# Patient Record
Sex: Male | Born: 1935 | Race: White | Hispanic: No | Smoking: Former smoker
Health system: Southern US, Community
[De-identification: ages and names within clinical notes are randomized; demographics above are authoritative.]

## PROBLEM LIST (undated history)

## (undated) DIAGNOSIS — J449 Chronic obstructive pulmonary disease, unspecified: Secondary | ICD-10-CM

## (undated) DIAGNOSIS — R739 Hyperglycemia, unspecified: Secondary | ICD-10-CM

## (undated) DIAGNOSIS — I10 Essential (primary) hypertension: Secondary | ICD-10-CM

## (undated) DIAGNOSIS — E162 Hypoglycemia, unspecified: Secondary | ICD-10-CM

## (undated) DIAGNOSIS — I442 Atrioventricular block, complete: Secondary | ICD-10-CM

## (undated) DIAGNOSIS — F0390 Unspecified dementia without behavioral disturbance: Secondary | ICD-10-CM

## (undated) HISTORY — DX: Hyperglycemia, unspecified: R73.9

## (undated) HISTORY — PX: VEIN LIGATION AND STRIPPING: SHX2653

## (undated) HISTORY — PX: COLONOSCOPY: SHX174

## (undated) HISTORY — DX: Essential (primary) hypertension: I10

## (undated) HISTORY — DX: Chronic obstructive pulmonary disease, unspecified: J44.9

---
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
## 1999-12-22 ENCOUNTER — Encounter: Payer: Self-pay | Admitting: Emergency Medicine

## 1999-12-22 ENCOUNTER — Inpatient Hospital Stay (HOSPITAL_COMMUNITY): Admission: EM | Admit: 1999-12-22 | Discharge: 1999-12-24 | Payer: Self-pay | Admitting: Emergency Medicine

## 1999-12-23 ENCOUNTER — Encounter: Payer: Self-pay | Admitting: Family Medicine

## 2003-10-17 ENCOUNTER — Encounter: Admission: RE | Admit: 2003-10-17 | Discharge: 2003-10-17 | Payer: Self-pay | Admitting: Gastroenterology

## 2005-10-25 ENCOUNTER — Ambulatory Visit (HOSPITAL_COMMUNITY): Admission: RE | Admit: 2005-10-25 | Discharge: 2005-10-25 | Payer: Self-pay | Admitting: Gastroenterology

## 2005-10-25 ENCOUNTER — Encounter (INDEPENDENT_AMBULATORY_CARE_PROVIDER_SITE_OTHER): Payer: Self-pay | Admitting: Specialist

## 2006-02-19 ENCOUNTER — Encounter: Payer: Self-pay | Admitting: Emergency Medicine

## 2006-02-20 ENCOUNTER — Inpatient Hospital Stay (HOSPITAL_COMMUNITY): Admission: EM | Admit: 2006-02-20 | Discharge: 2006-02-22 | Payer: Self-pay | Admitting: Emergency Medicine

## 2008-03-14 ENCOUNTER — Encounter: Admission: RE | Admit: 2008-03-14 | Discharge: 2008-03-14 | Payer: Self-pay | Admitting: Gastroenterology

## 2009-04-23 ENCOUNTER — Emergency Department (HOSPITAL_COMMUNITY): Admission: EM | Admit: 2009-04-23 | Discharge: 2009-04-23 | Payer: Self-pay | Admitting: Emergency Medicine

## 2010-09-20 ENCOUNTER — Inpatient Hospital Stay (HOSPITAL_COMMUNITY)
Admission: EM | Admit: 2010-09-20 | Discharge: 2010-09-22 | Payer: Self-pay | Source: Home / Self Care | Attending: Internal Medicine | Admitting: Internal Medicine

## 2010-09-21 ENCOUNTER — Encounter (INDEPENDENT_AMBULATORY_CARE_PROVIDER_SITE_OTHER): Payer: Self-pay | Admitting: Internal Medicine

## 2010-12-06 LAB — DIFFERENTIAL
Basophils Relative: 0 % (ref 0–1)
Eosinophils Absolute: 0 10*3/uL (ref 0.0–0.7)
Lymphocytes Relative: 4 % — ABNORMAL LOW (ref 12–46)
Monocytes Absolute: 0.9 10*3/uL (ref 0.1–1.0)
Monocytes Relative: 7 % (ref 3–12)
Neutro Abs: 10.1 10*3/uL — ABNORMAL HIGH (ref 1.7–7.7)

## 2010-12-06 LAB — CBC
HCT: 35.9 % — ABNORMAL LOW (ref 39.0–52.0)
Hemoglobin: 12.6 g/dL — ABNORMAL LOW (ref 13.0–17.0)
MCH: 29.9 pg (ref 26.0–34.0)
MCHC: 33.7 g/dL (ref 30.0–36.0)
MCHC: 34.8 g/dL (ref 30.0–36.0)
MCV: 86.7 fL (ref 78.0–100.0)
Platelets: 226 10*3/uL (ref 150–400)
RBC: 4.21 MIL/uL — ABNORMAL LOW (ref 4.22–5.81)
RDW: 14.4 % (ref 11.5–15.5)
WBC: 10.8 10*3/uL — ABNORMAL HIGH (ref 4.0–10.5)

## 2010-12-06 LAB — BASIC METABOLIC PANEL
BUN: 26 mg/dL — ABNORMAL HIGH (ref 6–23)
Calcium: 8.6 mg/dL (ref 8.4–10.5)
GFR calc non Af Amer: >60 mL/min (ref >60)
Glucose, Bld: 85 mg/dL (ref 70–99)
Potassium: 4.8 mEq/L (ref 3.5–5.1)

## 2010-12-06 LAB — GLUCOSE, CAPILLARY: Glucose-Capillary: 117 mg/dL — ABNORMAL HIGH (ref 70–99)

## 2010-12-06 LAB — URINALYSIS, ROUTINE W REFLEX MICROSCOPIC
Ketones, ur: NEGATIVE mg/dL
pH: 6 (ref 5.0–8.0)

## 2010-12-06 LAB — POCT CARDIAC MARKERS
CKMB, poc: 1.1 ng/mL (ref 1.0–8.0)
Myoglobin, poc: 111 ng/mL (ref 12–200)
Myoglobin, poc: 223 ng/mL (ref 12–200)
Troponin i, poc: 0.09 ng/mL (ref 0.00–0.09)
Troponin i, poc: 0.14 ng/mL — ABNORMAL HIGH (ref 0.00–0.09)

## 2010-12-06 LAB — COMPREHENSIVE METABOLIC PANEL
ALT: 21 U/L (ref 0–53)
AST: 22 U/L (ref 0–37)
AST: 24 U/L (ref 0–37)
BUN: 32 mg/dL — ABNORMAL HIGH (ref 6–23)
CO2: 26 mEq/L (ref 19–32)
Calcium: 9 mg/dL (ref 8.4–10.5)
Calcium: 9.4 mg/dL (ref 8.4–10.5)
Chloride: 103 mEq/L (ref 96–112)
GFR calc Af Amer: 58 mL/min — ABNORMAL LOW (ref >60)
GFR calc non Af Amer: 55 mL/min — ABNORMAL LOW (ref >60)
Glucose, Bld: 178 mg/dL — ABNORMAL HIGH (ref 70–99)
Potassium: 4 mEq/L (ref 3.5–5.1)
Potassium: 4.2 mEq/L (ref 3.5–5.1)
Sodium: 133 mEq/L — ABNORMAL LOW (ref 135–145)
Sodium: 134 mEq/L — ABNORMAL LOW (ref 135–145)
Total Bilirubin: 0.5 mg/dL (ref 0.3–1.2)
Total Protein: 7.1 g/dL (ref 6.0–8.3)

## 2010-12-06 LAB — BLOOD GAS, ARTERIAL
Bicarbonate: 23 mEq/L (ref 20.0–24.0)
O2 Saturation: 89.4 %
Patient temperature: 98.6

## 2010-12-06 LAB — HEMOGLOBIN A1C: Mean Plasma Glucose: 126 mg/dL — ABNORMAL HIGH (ref <117)

## 2010-12-06 LAB — CARDIAC PANEL(CRET KIN+CKTOT+MB+TROPI)
CK, MB: 2.5 ng/mL (ref 0.3–4.0)
Total CK: 117 U/L (ref 7–232)
Troponin I: 0.02 ng/mL (ref 0.00–0.06)

## 2010-12-06 LAB — D-DIMER, QUANTITATIVE: D-Dimer, Quant: 0.42 ug/mL-FEU (ref 0.00–0.48)

## 2010-12-06 LAB — URINE CULTURE
Colony Count: NO GROWTH
Culture  Setup Time: 201112270409
Culture: NO GROWTH

## 2010-12-06 LAB — LIPID PANEL
HDL: 44 mg/dL (ref >39)
Triglycerides: 35 mg/dL (ref <150)

## 2010-12-06 LAB — PROTIME-INR: Prothrombin Time: 13.6 seconds (ref 11.6–15.2)

## 2010-12-06 LAB — TSH: TSH: 0.575 u[IU]/mL (ref 0.350–4.500)

## 2011-01-02 LAB — POCT CARDIAC MARKERS
CKMB, poc: 1.2 ng/mL (ref 1.0–8.0)
Myoglobin, poc: 62.2 ng/mL (ref 12–200)

## 2011-01-02 LAB — DIFFERENTIAL
Lymphocytes Relative: 6 % — ABNORMAL LOW (ref 12–46)
Lymphs Abs: 0.4 10*3/uL — ABNORMAL LOW (ref 0.7–4.0)
Neutro Abs: 7.2 10*3/uL (ref 1.7–7.7)
Neutrophils Relative %: 92 % — ABNORMAL HIGH (ref 43–77)

## 2011-01-02 LAB — URINALYSIS, ROUTINE W REFLEX MICROSCOPIC
Glucose, UA: NEGATIVE mg/dL
Nitrite: NEGATIVE
Protein, ur: NEGATIVE mg/dL
Urobilinogen, UA: 0.2 mg/dL (ref 0.0–1.0)

## 2011-01-02 LAB — CBC
Platelets: 234 10*3/uL (ref 150–400)
WBC: 7.8 10*3/uL (ref 4.0–10.5)

## 2011-01-02 LAB — POCT I-STAT, CHEM 8
BUN: 19 mg/dL (ref 6–23)
Creatinine, Ser: 1 mg/dL (ref 0.4–1.5)
Potassium: 3.9 mEq/L (ref 3.5–5.1)
Sodium: 139 mEq/L (ref 135–145)

## 2011-01-02 LAB — GLUCOSE, CAPILLARY

## 2011-02-11 NOTE — Discharge Summary (Signed)
[place]. [facility]  Patient:    William Cisneros, William Cisneros                      MRN: [id number] Adm. Date:  [id number] Disc. Date: 12/24/99 Attending:  Marily Memos                           Discharge Summary  ADMITTING DIAGNOSIS:  Syncopal episode.  DISCHARGE DIAGNOSES: 1. Syncopal episode secondary to orthostatic hypotension from flu-like illness.  2. Hypercholesterolemia. 3. Borderline hypertension. 4. Hyperglycemia. 5. Status post hemorrhoidectomy and varicose vein surgery. 5. Old right bundle branch block.  CONSULTS:  None.  PROCEDURES:  Head CT showing no acute changes.  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  EKG showing normal sinus rhythm with right bundle branch block.  HOSPITAL COURSE:  [name] was admitted after having a syncopal episode for several minutes after he had some flu-like symptoms the past few days before admission.  He had not been eating or drinking very much.  This was after exertion. His wife heard him fall and EMS was called and then he was transferred to the emergency room.  Head CT revealed no acute changes.  On exam, he did have intention tremor of the left arm and right tongue deviation.  He was also orthostatic. Head MRI revealed an old punctate lacunar infarct of the pons but no other findings other than some small vessel disease.  He was also hyperglycemic slightly on admission and hemoglobin A1C was noted to be slightly elevated at 7.6.  He was admitted for observation.  Telemetry revealed no abnormalities.  He felt better  within a day.  Orthostatic hypotension resolved.  All tests were reviewed with he patient and his wife.  LABORATORY DATA:  Hemoglobin A1C 7.6, WBC 4.0, hemoglobin 13.1, platelets 199, 8% neutrophils, ANC 3.2, sed rate 15, INR 1.0l, sodium 134, potassium 4.1, chloride 104, bicarb 24, glucose 123, BUN 17,  creatinine 1.0, calcium 7.9, total protein  5.8, albumin 2.7, AST 26, ALT 18, alkaline phosphatase 57, total bilirubin 0.5,  lipase 40, CK 57, MB less than 0.3, troponin I less than 0.03.  DISCHARGE MEDICATIONS:  Enteric-coated aspirin 325 mg one p.o. q.d.  FOLLOW-UP:  Patient is to make an appointment to follow up with Dr. Arlyce Dice in our office for hyperglycemia, MRI findings of old, very small infarct and also for is blood pressure that was borderline this admission, also instructed to follow a low-fat, low-sodium diet with no added sugar.  He will call if he has any problems.  CONDITION AT DISCHARGE:  Improved. DD:  12/24/99 TD:  12/24/99 Job: [id number]

## 2011-02-11 NOTE — Op Note (Signed)
[name], [name]             ACCOUNT NO.:  [id number]   MEDICAL RECORD NO.:  [id number]          PATIENT TYPE:  AMB   LOCATION:  ENDO                         FACILITY:  [facility]   PHYSICIAN:  Danise Edge, M.D.   DATE OF BIRTH:  Aug 20, 1924   DATE OF PROCEDURE:  10/25/2005  DATE OF DISCHARGE:                                 OPERATIVE REPORT   PROCEDURE:  Esophagogastroduodenoscopy.   REFERRING PHYSICIAN:  Dr. Dara Hoyer, [facility].   INDICATIONS:  Mr. William Cisneros is a 77 year old male born [birth year].  William Cisneros has intermittent solid food dysphagia. He underwent a barium  tablet with liquid barium swallow. There was transient hangup of the barium  tablet at the level of the aortic arch; cricopharyngeal spasm was also  noted. William Cisneros wife developed esophageal cancer.   ENDOSCOPIST:  Danise Edge, M.D.   PREMEDICATION:  Versed 5 mg, Demerol 50 mg.   DESCRIPTION OF PROCEDURE:  After obtaining informed consent, William Cisneros was  placed in the left lateral decubitus position. I administered intravenous  Demerol and intravenous Versed to achieve conscious sedation for the  procedure. The patient's blood pressure, oxygen saturation and cardiac  rhythm were monitored throughout the procedure and documented in the medical  record.   The Olympus gastroscope was passed through the posterior hypopharynx into  the proximal esophagus without difficulty. The hypopharynx, larynx and vocal  cords appeared normal.   ESOPHAGOSCOPY:  The proximal, mid and lower segments of the esophageal  mucosa appear completely normal. There is no endoscopic evidence for the  presence of esophageal obstruction, esophageal stricture formation,  Barrett's esophagus or erosive esophagitis. The squamocolumnar junction is  noted at approximately 40 cm from the incisor teeth.   GASTROSCOPY:  Retroflexed view of the gastric cardia and fundus was normal.  The diaphragmatic  hiatus is only slightly patulous. The gastric body appears  normal. There are three punctate erosions in the gastric antrum with exudate  basis. Biopsies were performed to rule out Helicobacter pylori antral  gastritis. The pylorus appears normal.   DUODENOSCOPY:  There is a small isolated erosion in the duodenal bulb with a  normal-appearing second portion of  duodenum and third portion of duodenum.   ASSESSMENT:  1.  Erosions in the gastric antrum biopsied.  2.  A small isolated erosion is noted in the duodenal bulb.  3.  The esophagus appears completely normal.           ______________________________  Danise Edge, M.D.     MJ/MEDQ  D:  10/25/2005  T:  10/26/2005  Job:  [id number]   cc:   Teena Irani. Arlyce Dice, M.D.  Fax: [phone]

## 2011-02-11 NOTE — Discharge Summary (Signed)
William Cisneros, William Cisneros             ACCOUNT NO.:  [id number]   MEDICAL RECORD NO.:  [id number]          PATIENT TYPE:  INP   LOCATION:  1610                         FACILITY:  [facility]   PHYSICIAN:  Isidor Holts, M.D.  DATE OF BIRTH:  17-Nov-1923   DATE OF ADMISSION:  02/20/2006  DATE OF DISCHARGE:  02/22/2006                                 DISCHARGE SUMMARY   DISCHARGE DIAGNOSES:  1.  Cervical radiculopathy.  2.  Acute gastroenteritis.  3.  Dehydration/Presyncopal episode.  4.  History of dyslipidemia.  5.  History of previous transient ischemic attack.  6.  Hypertension.   DISCHARGE MEDICATIONS:  1.  Aspirin 81 mg p.o. daily.  2.  Altace 10 mg p.o. daily.  3.  Protonix 40 mg p.o. daily.  4.  Flexeril 10 mg p.o. nightly.  5.  Methylprednisone Dosepak starting with 8 mg p.o. t.i.d., then taper      Methylprednisone Dosepak over the next 5 days.   PROCEDURES:  1.  Brain MRI dated Feb 17, 2006:  This showed atrophy with moderate small      vessel disease-type changes, no acute infarct or abnormal intracranial      enhancing lesion.  2.  Brain MRA dated Feb 17, 2006:  This showed no medium or large vessel      significant stenosis or occlusion.  3.  Two-view chest x-ray dated Feb 17, 2006:  This showed chronic      obstructive pulmonary disease, but no acute findings.   CONSULTATIONS:  Dr. Porfirio Mylar Dohmeier, [facility].   ADMISSION HISTORY:  As in H&P notes of Feb 20, 2006, dictated by Dr. Freeman Caldron. However, in brief, this is a 77 year old male, with known history of  dyslipidemia, previous TIA, hypertension, prior history of gastritis,  associated with H. pylori, status post eradicative treatment, who presents  initially on Feb 19, 2006 with left facial numbness, left facial  droop/slurring of speech.  CT scan done at that time, showed mild small  vessel ischemic changes in periventricular white matter.  The patient was  subsequently allowed home from the emergenc  department, following resolution  of symptoms.  On detailed questioning, however, it appears that the  patient's symptoms had started approximately 4 days prior, and the patient  had developed nausea and vomiting over 2-3 days prior to this presentation.  While the patient was in his PMD's office on Feb 20, 2006, he had a  presyncopal episode, associated with dizziness, nausea and vomiting and was  brought to the emergency department.  He was subsequently admitted for  further evaluation, investigation and management.   CLINICAL COURSE:  PROBLEM #1 - ACUTE GASTRITIS:  The patient presented with  a 2-3-day history of nausea, epigastric pain and vomiting.  He denies  diarrhea.  There was no clustering of cases, no ingestion of unusual foods,  no recent travel.  The patient responded to intravenous fluid hydration,  antiemetics, proton pump inhibitor treatment with good clinical effect.  By  Feb 21, 2006, the patient no longer had any further episodes of vomiting,  was able to maintain oral  intake without any problems whatsoever.  Abdominal  discomfort also resolved.   PROBLEM #2 - PRESYNCOPAL EPISODE:  Although there are varied accounts of  this episode, the patient states that as he got up from a chair in his PMD's  office, he experienced nausea, severe dizziness, sat back down and vomited.  Family states that the patient actually passed out over the table.  Be  that as it may, the patient was found to have orthostasis on physical  evaluation on Feb 21, 2006.  Also at that time, his BUN was 21 with a  creatinine of 1.2.  These features were suggestive of dehydration,  associated with acute gastritis and vomiting.  He responded to intravenous  fluid infusion with normal saline.  By Feb 22, 2006, he was completely  asymptomatic, was able to ambulate without any problems whatsoever.   PROBLEM #3 - CERVICAL RADICULOPATHY:  The patient presented with a 4-day  history of left-sided facial  numbness, also some scalp tenderness.  In  addition, he experienced escalation of aforementioned symptoms, on lateral  rotation of neck to the left.  There was no associated jaw or tongue  claudication, no visual symptoms, this was clearly not a TIA, although the  patient has had TIA in the past.  Brain imaging, i.e., MRI/MRA, showed no  evidence of acute infarct or other enhancing lesion.  The patient certainly,  had no focal neurology, even on objective testing, of either a sensory or  motor nature.  ESR done, to rule out possible temporal arteritis, was  37mm/hr, i.e., unremarkable.  Because of puzzling neurological  symptomatology, it was also felt that Neurology input would be useful.  Neurology consultation was therefore called, and was kindly provided by Dr.  Porfirio Mylar Dohmeier, who concluded that the patient probably had a cervical  radiculopathy, associated with tenderness on deep palpation of left  supraclavicular fossa.  She recommended the utilization of Flexeril and a  Medrol Dosepak. This was instituted, with rather dramatic amelioration of  the patient's symptoms.  Per Dr. Oliva Bustard recommendation, should the  patient still be symptomatic on this regimen after 14 days, it might be well  worthwhile to arrange an outpatient EMG and nerve conduction studies.  The  patient underwent a Speech Pathology evaluation during the course of his  hospital stay.  There was no evidence of dysphagia.  RPR was nonreactive.  TSH was 1.201. Vitamin B12 was 299.   PROBLEM #4 - HISTORY OF HYPERTENSION:  This was managed with pre-admission  dosage of Altace.  On Feb 22, 2006, there was a slight bump in blood  pressure to 161/86 mmHg. At the time, the patient was on intravenous  infusion of normal saline. This has been discontinued of course, and we  expect his blood pressure to normalize.  However, we shall expect the patient's primary care physician to continue to monitor the patient's blood   pressure, and adjust his medications as indicated.   DISPOSITION:  The patient was considered clinically stable for discharge on  Feb 22, 2006.   DIET:  No restrictions, other than healthy-heart diet.   ACTIVITY:  As tolerated.   WOUND CARE:  Not applicable.   PAIN MANAGEMENT:  Not applicable.   FOLLOWUP INSTRUCTIONS:  The patient is instructed to follow up with Dr.  Dara Hoyer, his PMD, within 1 week.  He has been instructed to call for an  appointment and has verbalized understanding.   SPECIAL INSTRUCTIONS:  The patient is to undergo  PT/OT on an outpatient  basis. This has been arranged.  Per Dr. Porfirio Mylar Dohmeier's instructions, if  the patient does not experience significant improvement within 14 days,  outpatient EMG/nerve conduction studies is recommended.  We expect the  patient's PMD, Dr. Dara Hoyer, to arrange this under the auspices of Dr.  Melvyn Novas, [facility].      Isidor Holts, M.D.  Electronically Signed     CO/MEDQ  D:  02/22/2006  T:  02/22/2006  Job:  [id number]   cc:   Teena Irani. Arlyce Dice, M.D.  Fax: [phone]   Melvyn Novas, M.D.  Fax: [phone]

## 2011-02-11 NOTE — H&P (Signed)
[place]. [facility]  Patient:    William Cisneros, William Cisneros                      MRN: [id number] Adm. Date:  [id number] Attending:  Marily Memos                         History and Physical  CHIEF COMPLAINT:  Fainted.  HISTORY OF PRESENT ILLNESS:  Mr. Skibicki is a 77 year old white male who presented to the emergency room by ambulance after a syncopal episode at home.  He awoke his morning, felt fine other than having his "flu-like symptoms" over the past several days.  He later walked outside to get the paper, came back into the house.  He elt dizzy (denied spinning sensation) and weak and passed out.  His wife heard him all and states he was unconscious for several minutes.  He was nauseated when he awoke, with diaphoresis and increased dizziness when sitting upright.  He complains of  prior episodes all of his life, the last several years ago, of syncope.  The patient attributed these to low blood sugar, but has no history of hypoglycemia in his medical records.  He complains of flu symptoms with chills, nasal congestion, and drainage, with a sore throat and decreased p.o. intake for the past three days. He denies fever, but has not taken his temperature.  He denies chest pain, dyspnea, headache, visual changes, unilateral weakness or numbness.  PAST MEDICAL HISTORY: 1. Hypercholesterolemia with a total of 224, HDL 72. 2. Hemorrhoidectomy. 3. Varicose vein surgery. 4. Right bundle branch block.  SOCIAL HISTORY:  Negative for tobacco.  Occasional alcohol, the patient states o more than one ounce per week.  No drugs.  He is a retired [employer].  FAMILY HISTORY:  Positive for stroke in his father and grandfather.  Diabetes in his father.  Brother died of Hilda Blades disease.  Tremor in his brother and son.  REVIEW OF SYSTEMS:  Negative for weight loss, abdominal pain, BRBPR, melena, dysuria, frequency, diarrhea, constipation, edema.   The patient states he has no history of tremor.  PHYSICAL EXAMINATION:  VITAL SIGNS:  Temperature 97.4, BP supine 135/68, standing 103/51, pulse supine 74, upright 82, respirations 20, pulse oximetry 94% on room air.  GENERAL:  NAD.  SKIN:  Warm and dry.  HEENT:  TMs are clear with no cerumen.  No erythema or fluid.  EOMI.  PERRL. Fundi without lesions.  Oropharynx is clear, slightly dry.  Good dentition.  No exudate.  NECK:  Supple.  No TM or adenopathy.  No JVD.  No bruits.  LUNGS:  Clear to auscultation bilaterally, with decreased breath sounds.  No wheezes or crackles.  CV:  Regular rate and rhythm.  S1, S2.  No MHR.  ABDOMEN:  Positive bowel sounds.  NTND.  No HSM, no masses.  BACK:  No CVAT.  GU:  NEMG, uncircumcised, descended testicles, no masses.  RECTAL:  Normal tone.  Smooth prostate.  Guaiac-negative.  No masses.  Brown stool.  EXTREMITIES:  No CCE.  With 2+ radial and DP pulses, 1+ femoral pulses.  NEUROLOGIC:  Cranial nerves II-XII intact except for slight right tongue deviation, 5/5 motor, trace DTRs bilaterally.  Sensory is intact to fine touch.  FTN intact bilaterally with left intention tremor (new per patient).  LABORATORY DATA:  WBC 4.0, hemoglobin 13.1, hematocrit 38.8, MCV 86.6, platelets 199.  PT 13.1,  INR 1.0, PTT 29.  Sodium 134, potassium 4.1, chloride 104, bicarbonate 24, glucose 123, BUN 17, creatinine 1.0, calcium 7.9, total protein  5.8, albumin 2.7, AST 26, ALT 18, alkaline phosphatase 57, total bilirubin 0.5. CK 57, MB less than 0.3.  Troponin I less than 0.03.  EKG:  Normal sinus rhythm with RBBB.  Head CT shows no acute changes.  ASSESSMENT AND PLAN:  [age]-year-old white male with syncope, likely orthostatic secondary to recent viral infection, though stroke does need to be ruled out, especially with slight right tongue deviation and left intention tremor, although this could be coincidental, and the weakness is  bringing out the tremor that is less likely familial.  MRI with contrast will be ordered.  He will also be placed on telemetry to observe for any arrhythmias.  TSH and hemoglobin A1C to e checked, especially with slight elevated glucose and family history of diabetes. Daily orthostatics.  Carotid Dopplers.  Aspirin 325 mg, coated, q.d. DD:  12/22/99 TD:  12/22/99 Job: [id number]

## 2011-02-11 NOTE — Consult Note (Signed)
William Cisneros, William Cisneros NO.:  [id number]   MEDICAL RECORD NO.:  [id number]          PATIENT TYPE:  INP   LOCATION:  0454                         FACILITY:  [facility]   PHYSICIAN:  Melvyn Novas, M.D.  DATE OF BIRTH:  [birth year]   DATE OF CONSULTATION:  02/21/2006  DATE OF DISCHARGE:                                   CONSULTATION   REFERRING PHYSICIAN:  Isidor Holts, M.D.   CONSULTING PHYSICIAN:  Melvyn Novas, M.D.   This is an 77 year old, Caucasian, right-handed widower with the medical  record number [id number].  This gentleman was admitted on Feb 20, 2006.  Consult date is Feb 21, 2006, with a complaint of left-sided numbness.   Dr. Alison Murray note state that the patient had facial numbness and he originally  presented and a possible presyncopal or syncopal episode that was witnessed  by the family.  He also has a history of hypertension, acute gastritis,  dyslipidemia, and a remote history of TIAs.  The patient was stable in his  vital signs and laboratory results, and had an MRI of the brain this morning  which showed no acute stroke and the MRA was normal.  The patient states to  me that he has had neck pain on the left side, that worsens when he sits up  or resumes an erect posture, or when he strains.  It disappears when he  reclines and rests.  He states that he has strong pain medications over the  last 24 hours which did not influence his pain.   REVIEW OF SYSTEMS:  He denies headache, facial pain or numbness.  He denies  jaw claudication, dysphagia, dysphasia, vision, or GI problems.   FAMILY HISTORY:  He states he is one of eight and that hypertension runs in  his family.   SOCIAL HISTORY:  He is widowed and retired.  He is an ex[employer] man.   LABORATORY RESULTS:  Sed rate was 5.  White blood cell count 6.8, H&H 12.8  over 37.7, platelet count 258,000 with a normal Chem-7 and an INR of 1.0.   PHYSICAL EVALUATION:  GENERAL:  The patient is in no  acute distress.  VITAL SIGNS:  Are still stable.  The blood pressure runs systolically 110 to  120 systolic over 60 diastolic.  Respiratory rate is 18.  LUNGS:  Clear to auscultation No carotid bruit.  No peripheral clubbing,  cyanosis or edema.  NEUROLOGIC:  The patient is alert and oriented in no acute distress,  conversational and cooperative.  Cranial nerve exam shows pupils equal to  light with symmetric eye closure, full extraocular movements, symmetric  facial movements.  Tongue and uvula are midline.  He has full visual fields.  No temporal artery tenderness.  No TMJ tenderness.  Motor examination shows  equal strength, tone, and mass bilaterally.  Deep tendon reflexes are 1+.  Coordination by finger nose is intact.  The patient was also able to eat  food was knife and fork, coordinating this well.  He showed no clumsiness  with handling a straw.  Sensory:  The pain locus that I found  is deep in the  left supraclavicular fossa and seen on the left side only. This tenderness  is very focal and I wonder if the patient has a pinched nerve or if a  swollen lymph nodes might underlie this.   1.  I suggested to try Flexeril 10 mg q.h.s. a drug he can easily be      discharged on.  2.  I would also given him a Medrol dose pack if this is a tendon strain.  3.  And, I suggested physical therapy to loosen the left shoulder and neck      for movement.   If the patient should not recover from this within the next 14 days, I would  like him to undergo an EMG with nerve conduction study in the outpatient  setting.   Sincerely,      Melvyn Novas, M.D.  Electronically Signed     CD/MEDQ  D:  02/21/2006  T:  02/21/2006  Job:  [id number]   cc:   Isidor Holts, M.D.

## 2011-02-11 NOTE — H&P (Signed)
NAMEGEO, SLONE             ACCOUNT NO.:  [id number]   MEDICAL RECORD NO.:  [id number]          PATIENT TYPE:  INP   LOCATION:  1823                         FACILITY:  [facility]   PHYSICIAN:  Toby L. Fugate, D.O.   DATE OF BIRTH:  Jan 15, 1924   DATE OF ADMISSION:  02/20/2006  DATE OF DISCHARGE:                                HISTORY & PHYSICAL   PRIMARY CARE PHYSICIAN:  Production assistant, radio.   HISTORY OF PRESENT ILLNESS:  Mr. Letizia is a 77 year old Caucasian male who  was seen in the emergency room yesterday due to left facial numbness and  left facial droop and slurring of speech  A CT of the head showed mild small  vessel ischemic changes in the periventricular white matter.  The CT of the  head was otherwise unremarkable.  The patient's symptoms resolved, and he  was discharged.  Earlier today he began to have stabbing left facial pain.  The pain was located mainly behind his left ear and radiated up through his  left scalp.  Today there was no associated weakness.  However, there were  intermittent periods of slight numbness.  There was also no slurring of  speech today.  He saw his primary care physician and had a presyncopal  episode in the primary care physician's office and was thus sent here to the  ED for further evaluation.  He has also complained of some nausea and  vomiting over the past 2-3 days.  There has been no fevers or chills.  He  denies sore throat, stiffness of the neck, headache, chest pain, shortness  of breath, cough, abdominal symptoms, and urinary symptoms.  Currently he is  pain free.   PAST MEDICAL HISTORY/ PAST SURGICAL HISTORY:  1.  Hypercholesterolemia.  2.  Hemorrhoid surgery.  3.  Varicose vein surgery.  4.  Old right bundle branch block.  5.  Gastritis with documented H. pylori status post treatment.  6.  Hypertension.  7.  TIA.   MEDICATIONS:  1.  Altace 10 mg p.o. daily.  2.  Aspirin 81 mg p.o. daily.   ALLERGIES:  No known  drug allergies.   SOCIAL HISTORY:  He denies tobacco.  He has alcohol occasionally.  He is a  retired Hotel manager.   FAMILY HISTORY:  CVA, diabetes, Truddie Hidden Gehrig's disease.   REVIEW OF SYSTEMS:  A complete 12-point Review of Systems was obtained. The  review was negative except as in HPI.   PHYSICAL EXAMINATION:  VITAL SIGNS: Temperature 97.8, blood pressure 149/71,  pulse 68, respiratory rate 20.  HEENT: Pupils equally round and reactive to light.  Extraocular muscles  intact.  No scleral icterus.  Oropharynx clear and moist.  The posterior  oropharynx was slightly erythematous,  however, the patient stated that it  is not tender.  The patient's dentition looked relatively well.  NECK:  No JVD, no carotid bruit, no adenopathy.  HEART: Regular rate and rhythm.  No murmurs, rubs or gallops.  LUNGS: Clear to auscultation bilaterally.  No wheezes or rhonchi.  ABDOMEN:  Positive bowel sounds, nontender, nondistended.  EXTREMITIES:  No edema, clubbing or cyanosis.  NEUROLOGICAL EXAMINATION: Cranial nerves II-XII intact.  No focal deficits.  DTRs 2/4 all extremities.  Strength 5/5 all extremities.  There was no  tenderness to palpation of the patient's scalp. His neck was not stiff or  rigid.   LABORATORY DATA:  Sodium 137, potassium 4.3, chloride 105, BUN 21, glucose  101, creatinine 1.2.  White blood cell count 12.1, hemoglobin 14.5,  hematocrit 44, platelets 278.  Troponin negative x1. INR 1, PTT 29 seconds.  AST 18, ALT 17, alkaline phosphatase 69, total bilirubin 0.9, direct  bilirubin 0.2, indirect bilirubin 0.7.   EKG showed old right bundle branch block.  There is a new first degree AV  block.   ASSESSMENT/PLAN:  Left scalp tenderness and intermittent numbness.  The  etiology of the scalp tenderness/pain is not clear at this point.  Ideas  include atypical transient ischemic attack, temporal arteritis, possible  zoster, other.  I will admit the patient to a telemetry  bed.  I will request  an MRI/MRA of the head.  I will also order an ESR/CRP to evaluate the  patient for temporal arteritis.  The patient has no visual complaints.  I  will provide Lortab for pain control.  I will also check troponins.  If the  ESR is elevated, I will consider starting steroids.  1.  Leukocytosis. The etiology is not clear.  There is no focus of      infection.  I will request a UA/UC and blood cultures x2.  I will repeat      a CBC in the morning.  Given the nausea and vomiting, the patient could      very well have gastroenteritis.  2.  Intermittent nausea and vomiting.  The patient feels that nausea and      vomiting is due to hunger.  He does want to try to eat.  I will provide      a liquid diet.  I will also provide Phenergan as needed.  3.  Hypertension.  I will continue the patient on Altace.  4.  This patient is full code.      Toby L. Fugate, D.O.  Electronically Signed     TLF/MEDQ  D:  02/20/2006  T:  02/20/2006  Job:  [id number]

## 2011-04-07 ENCOUNTER — Other Ambulatory Visit (INDEPENDENT_AMBULATORY_CARE_PROVIDER_SITE_OTHER): Payer: Medicare Other

## 2011-04-07 DIAGNOSIS — R0989 Other specified symptoms and signs involving the circulatory and respiratory systems: Secondary | ICD-10-CM

## 2011-04-12 ENCOUNTER — Encounter: Payer: Self-pay | Admitting: Family Medicine

## 2011-04-12 DIAGNOSIS — I10 Essential (primary) hypertension: Secondary | ICD-10-CM | POA: Insufficient documentation

## 2011-04-12 DIAGNOSIS — R739 Hyperglycemia, unspecified: Secondary | ICD-10-CM | POA: Insufficient documentation

## 2011-04-13 NOTE — Procedures (Unsigned)
CAROTID DUPLEX EXAM  INDICATION:  Bruit.  HISTORY: Diabetes:  No. Cardiac:  No. Hypertension:  Yes. Smoking:  Quit in 1968. Previous Surgery:  No. CV History:  Dizziness and lightheadedness. Amaurosis Fugax No, Paresthesias No, Hemiparesis No.                                      RIGHT             LEFT Brachial systolic pressure:         122               126 Brachial Doppler waveforms:         Normal            Normal Vertebral direction of flow:        Antegrade         Antegrade DUPLEX VELOCITIES (cm/sec) CCA peak systolic                   71                91 ECA peak systolic                   205               191 ICA peak systolic                   162               70 ICA end diastolic                   34                16 PLAQUE MORPHOLOGY:                  Calcific          Calcific PLAQUE AMOUNT:                      Minimal/moderate  Minimal/moderate PLAQUE LOCATION:                    CCA, ICA, ECA     CCA, ICA, ECA  IMPRESSION: 1. Right internal carotid artery velocities suggest 1% to 39% stenosis     (high end of range). 2. Left internal carotid artery velocities suggest 1% to 39% stenosis. 3. Bilateral external carotid artery stenosis.  ___________________________________________ Fransisco Hertz, MD  EM/MEDQ  D:  04/07/2011  T:  04/07/2011  Job:  [id number]

## 2011-04-26 ENCOUNTER — Ambulatory Visit (HOSPITAL_COMMUNITY)
Admission: RE | Admit: 2011-04-26 | Discharge: 2011-04-26 | Disposition: A | Discharge status: Home / Self Care | Payer: Medicare Other | Source: Ambulatory Visit | Attending: Internal Medicine | Admitting: Internal Medicine

## 2011-04-26 ENCOUNTER — Other Ambulatory Visit: Payer: Self-pay | Admitting: Internal Medicine

## 2011-04-26 DIAGNOSIS — R7309 Other abnormal glucose: Secondary | ICD-10-CM | POA: Insufficient documentation

## 2011-04-26 DIAGNOSIS — Z87891 Personal history of nicotine dependence: Secondary | ICD-10-CM

## 2011-04-26 DIAGNOSIS — F17200 Nicotine dependence, unspecified, uncomplicated: Secondary | ICD-10-CM | POA: Insufficient documentation

## 2011-04-26 DIAGNOSIS — C801 Malignant (primary) neoplasm, unspecified: Secondary | ICD-10-CM | POA: Insufficient documentation

## 2011-07-20 ENCOUNTER — Telehealth: Payer: Self-pay | Admitting: Gastroenterology

## 2011-07-20 NOTE — Telephone Encounter (Signed)
Pt was seen by Dr. Loreta Ave years ago and did not want to see her again. Dr. Chestine Spore is requesting the pt be seen sooner than first available due to rectal bleeding. Pt scheduled to see Dr. Arlyce Dice 07/21/11@9am . Pt aware of appt date and time. Dr. Laurena Slimmer office to fax records.

## 2011-07-21 ENCOUNTER — Encounter: Payer: Self-pay | Admitting: Gastroenterology

## 2011-07-21 ENCOUNTER — Ambulatory Visit (INDEPENDENT_AMBULATORY_CARE_PROVIDER_SITE_OTHER): Payer: Medicare Other | Admitting: Gastroenterology

## 2011-07-21 VITALS — BP 162/78 | HR 84 | Ht 70.0 in | Wt 165.4 lb

## 2011-07-21 DIAGNOSIS — K625 Hemorrhage of anus and rectum: Secondary | ICD-10-CM

## 2011-07-21 NOTE — Assessment & Plan Note (Addendum)
Bleeding could be due to hemorrhoidal bleeding. A more proximal colonic bleeding source should be ruled out.  Recommendations #1 colonoscopy

## 2011-07-21 NOTE — Progress Notes (Signed)
Mr. William Cisneros is a pleasant 15 for a white male with history of COPD and hypertension, referred at the request of Dr. Chestine Spore for evaluation of rectal bleeding. On multiple occasions he seen a small amount of blood mixed with the stools. He is without abdominal or rectal pain. Stools more recently have been more frequent.      Past Medical History  Diagnosis Date  . Hypertension   . Hyperglycemia   . COPD (chronic obstructive pulmonary disease)    Past Surgical History  Procedure Date  . Hemorrhoid surgery 1958  . Vein ligation and stripping     rt leg    reports that he quit smoking about 44 years ago. He has never used smokeless tobacco. He reports that he does not drink alcohol or use illicit drugs. family history is not on file.  Current medications and social history were reviewed in [employer] electronic medical record  Review of Systems: Pertinent positive and negative review of systems were noted in the above HPI section. All other review of systems were otherwise negative.  Vital signs were reviewed in today's medical record Physical Exam: General: Well developed , well nourished, no acute distress Head: Normocephalic and atraumatic Eyes:  sclerae anicteric, EOMI Ears: Normal auditory acuity Mouth: No deformity or lesions Neck: Supple, no masses or thyromegaly Lungs: Clear throughout to auscultation Heart: Regular rate and rhythm; no murmurs, rubs or bruits Abdomen: Soft, non tender and non distended. No masses, hepatosplenomegaly or hernias noted. Normal Bowel sounds Rectal: There are no rectal masses. Stool is Hemoccult negative Musculoskeletal: Symmetrical with no gross deformities  Skin: No lesions on visible extremities Pulses:  Normal pulses noted Extremities: No clubbing, cyanosis, edema or deformities noted Neurological: Alert oriented x 4, grossly nonfocal Cervical Nodes:  No significant cervical adenopathy Inguinal Nodes: No significant inguinal  adenopathy Psychological:  Alert and cooperative. Normal mood and affect

## 2011-07-21 NOTE — Patient Instructions (Signed)
Colonoscopy A colonoscopy is an exam to evaluate your entire colon. In this exam, your colon is cleansed. A long fiberoptic tube is inserted through your rectum and into your colon. The fiberoptic scope (endoscope) is a long bundle of enclosed and very flexible fibers. These fibers transmit light to the area examined and send images from that area to your caregiver. Discomfort is usually minimal. You may be given a drug to help you sleep (sedative) during or prior to the procedure. This exam helps to detect lumps (tumors), polyps, inflammation, and areas of bleeding. Your caregiver may also take a small piece of tissue (biopsy) that will be examined under a microscope. LET YOUR CAREGIVER KNOW ABOUT:   Allergies to food or medicine.   Medicines taken, including vitamins, herbs, eyedrops, over-the-counter medicines, and creams.   Use of steroids (by mouth or creams).   Previous problems with anesthetics or numbing medicines.   History of bleeding problems or blood clots.   Previous surgery.   Other health problems, including diabetes and kidney problems.   Possibility of pregnancy, if this applies.  BEFORE THE PROCEDURE   A clear liquid diet may be required for 2 days before the exam.   Ask your caregiver about changing or stopping your regular medications.   Liquid injections (enemas) or laxatives may be required.   A large amount of electrolyte solution may be given to you to drink over a short period of time. This solution is used to clean out your colon.   You should be present 60 minutes prior to your procedure or as directed by your caregiver.  AFTER THE PROCEDURE   If you received a sedative or pain relieving medication, you will need to arrange for someone to drive you home.   Occasionally, there is a little blood passed with the first bowel movement. Do not be concerned.  FINDING OUT THE RESULTS OF YOUR TEST Not all test results are available during your visit. If your test  results are not back during the visit, make an appointment with your caregiver to find out the results. Do not assume everything is normal if you have not heard from your caregiver or the medical facility. It is important for you to follow up on all of your test results. HOME CARE INSTRUCTIONS   It is not unusual to pass moderate amounts of gas and experience mild abdominal cramping following the procedure. This is due to air being used to inflate your colon during the exam. Walking or a warm pack on your belly (abdomen) may help.   You may resume all normal meals and activities after sedatives and medicines have worn off.   Only take over-the-counter or prescription medicines for pain, discomfort, or fever as directed by your caregiver. Do not use aspirin or blood thinners if a biopsy was taken. Consult your caregiver for medicine usage if biopsies were taken.  SEEK IMMEDIATE MEDICAL CARE IF:   You have a fever.   You pass large blood clots or fill a toilet with blood following the procedure. This may also occur 10 to 14 days following the procedure. This is more likely if a biopsy was taken.   You develop abdominal pain that keeps getting worse and cannot be relieved with medicine.  Document Released: 09/09/2000 Document Revised: 05/25/2011 Document Reviewed: 04/24/2008 [facility] Patient Information 2012 [place], [place]. Your Colonoscopy is scheduled on Friday 07/22/2011 at 11am on the 4th floor

## 2011-07-22 ENCOUNTER — Encounter: Payer: Self-pay | Admitting: Gastroenterology

## 2011-07-22 ENCOUNTER — Ambulatory Visit (AMBULATORY_SURGERY_CENTER): Payer: Medicare Other | Admitting: Gastroenterology

## 2011-07-22 DIAGNOSIS — D126 Benign neoplasm of colon, unspecified: Secondary | ICD-10-CM

## 2011-07-22 DIAGNOSIS — K648 Other hemorrhoids: Secondary | ICD-10-CM

## 2011-07-22 DIAGNOSIS — K625 Hemorrhage of anus and rectum: Secondary | ICD-10-CM

## 2011-07-22 DIAGNOSIS — K5730 Diverticulosis of large intestine without perforation or abscess without bleeding: Secondary | ICD-10-CM

## 2011-07-22 MED ORDER — SODIUM CHLORIDE 0.9 % IV SOLN: 500.0000 mL | INTRAVENOUS | Status: DC

## 2011-07-22 NOTE — Progress Notes (Signed)
Pt's first blood pressure in the procedure room 145/107 Dr. Arlyce Dice was made aware of this.  No orders given. Maw  Pt's blood pressure did come down after sedation was given.  124/66 and 91/43.  Iv open drip. Dr. Arlyce Dice is aware. Maw  Norcel Pettiway, Tech in to relief Dixie Doss,rn/Tech.  maw  Pt tolerated the colonoscopy very well. maw

## 2011-07-22 NOTE — Patient Instructions (Signed)
Discharge instructions given with verbal understanding.  Handouts on polyps,diverticulosis and hemorrhoids given.  Resume previous medications. 

## 2011-07-25 ENCOUNTER — Telehealth: Payer: Self-pay | Admitting: *Deleted

## 2011-07-25 NOTE — Telephone Encounter (Signed)

## 2011-08-16 ENCOUNTER — Ambulatory Visit: Payer: Medicare Other | Admitting: Gastroenterology

## 2011-10-13 DIAGNOSIS — I10 Essential (primary) hypertension: Secondary | ICD-10-CM | POA: Diagnosis not present

## 2011-12-10 ENCOUNTER — Encounter (HOSPITAL_COMMUNITY): Payer: Self-pay

## 2011-12-10 ENCOUNTER — Emergency Department (HOSPITAL_COMMUNITY)
Admission: EM | Admit: 2011-12-10 | Discharge: 2011-12-10 | Disposition: A | Discharge status: Home / Self Care | Payer: Medicare Other | Attending: Emergency Medicine | Admitting: Emergency Medicine

## 2011-12-10 DIAGNOSIS — I10 Essential (primary) hypertension: Secondary | ICD-10-CM | POA: Diagnosis not present

## 2011-12-10 DIAGNOSIS — R404 Transient alteration of awareness: Secondary | ICD-10-CM | POA: Diagnosis not present

## 2011-12-10 DIAGNOSIS — R55 Syncope and collapse: Secondary | ICD-10-CM | POA: Diagnosis not present

## 2011-12-10 DIAGNOSIS — R42 Dizziness and giddiness: Secondary | ICD-10-CM | POA: Insufficient documentation

## 2011-12-10 DIAGNOSIS — J449 Chronic obstructive pulmonary disease, unspecified: Secondary | ICD-10-CM | POA: Diagnosis not present

## 2011-12-10 DIAGNOSIS — Z0000 Encounter for general adult medical examination without abnormal findings: Secondary | ICD-10-CM

## 2011-12-10 DIAGNOSIS — J4489 Other specified chronic obstructive pulmonary disease: Secondary | ICD-10-CM | POA: Insufficient documentation

## 2011-12-10 LAB — POCT I-STAT, CHEM 8
BUN: 36 mg/dL — ABNORMAL HIGH (ref 6–23)
Chloride: 106 mEq/L (ref 96–112)
Sodium: 138 mEq/L (ref 135–145)

## 2011-12-10 NOTE — ED Provider Notes (Signed)
History     CSN: [id number]  Arrival date & time 12/10/11  1233   First MD Initiated Contact with Patient 12/10/11 1240      Chief Complaint  Patient presents with  . Near Syncope    (Consider location/radiation/quality/duration/timing/severity/associated sxs/prior treatment) The history is provided by the patient.   the patient is an 78 year old male, with hypertension.  He does not smoke cigarettes.  Denies a history of coronary artery disease.  He was working out in the yard today, and became lightheaded.  He sat down.  His neighbors became concerned and called EMS.  They reported that he fainted.  The patient denies passing out.  He denies chest pain, palpitations, nausea, vomiting, or sweating.  Prior to the episode.  He denies any symptoms at this time.  He states that this has occurred in the past when he became hypoglycemic.  He denies missing breakfast or lunch today.  He denies recent illness.  Past Medical History  Diagnosis Date  . Hypertension   . Hyperglycemia   . COPD (chronic obstructive pulmonary disease)     Past Surgical History  Procedure Date  . Hemorrhoid surgery 1958  . Vein ligation and stripping     rt leg  . Colonoscopy     Family History  Problem Relation Age of Onset  . Colon cancer Neg Hx   . Esophageal cancer Neg Hx   . Stomach cancer Neg Hx     History  Substance Use Topics  . Smoking status: Former Smoker    Quit date: 09/26/1966  . Smokeless tobacco: Never Used  . Alcohol Use: No      Review of Systems  Constitutional: Negative for fever and chills.  Eyes: Negative for visual disturbance.  Respiratory: Negative for cough, chest tightness and shortness of breath.   Cardiovascular: Negative for chest pain, palpitations and leg swelling.  Gastrointestinal: Negative for nausea, abdominal pain, diarrhea and rectal pain.  Musculoskeletal: Negative for back pain.  Neurological: Negative for syncope and headaches.    Psychiatric/Behavioral: Negative for confusion.  All other systems reviewed and are negative.    Allergies  Review of patient's allergies indicates no known allergies.  Home Medications   Current Outpatient Rx  Name Route Sig Dispense Refill  . ADVIL PO Oral Take 1 tablet by mouth as needed.      Marland Kitchen METOPROLOL SUCCINATE ER 50 MG PO TB24 Oral Take 50 mg by mouth daily. Took 1/2 tablet 07-21-11    . OLMESARTAN-AMLODIPINE-HCTZ 40-5-12.5 MG PO TABS Oral Take by mouth.      . OMEPRAZOLE 20 MG PO CPDR Oral Take 20 mg by mouth daily.        BP 120/55  Pulse 63  Temp(Src) 97.5 F (36.4 C) (Oral)  Resp 15  SpO2 96%  Physical Exam  Vitals reviewed. Constitutional: He is oriented to person, place, and time. He appears well-developed and well-nourished. No distress.  HENT:  Head: Normocephalic and atraumatic.  Eyes: EOM are normal. Pupils are equal, round, and reactive to light.  Neck: Normal range of motion. Neck supple.        No carotid bruits  Cardiovascular: Normal rate, regular rhythm and normal heart sounds.   No murmur heard. Pulmonary/Chest: Effort normal and breath sounds normal. No respiratory distress. He has no wheezes. He has no rales.  Abdominal: Soft. Bowel sounds are normal. He exhibits no distension and no mass. There is no tenderness. There is no rebound and no guarding.  Musculoskeletal: Normal range of motion. He exhibits no edema and no tenderness.  Neurological: He is alert and oriented to person, place, and time. No cranial nerve deficit.  Skin: Skin is warm and dry. He is not diaphoretic.  Psychiatric: He has a normal mood and affect. His behavior is normal.    ED Course  Procedures (including critical care time) 78 year old, healthy, male, with no risk factors for coronary disease except for his age and sex.  Presents to the emergency department after what his neighbors thought was an episode of lightheadedness.  The patient specifically denies becoming  lightheaded, having any pain or fainting.  He is asymptomatic now and has not had any recent illnesses.  He states that this is occurred in the past when he was hypoglycemic.  However, he is not missed any meals recently, and he does not take any thing that would lower his blood sugar.  We will perform a blood tests on him and if it is negative.  I will release him.  Labs Reviewed - No data to display No results found.   No diagnosis found.    MDM  Normal.  Examination No evidence of illness or injury        Cheri Guppy, MD 12/10/11 [phone]

## 2011-12-10 NOTE — ED Notes (Signed)
EMS reports pt working in yard all day, felt super tired and felt like he was going to pass out, no LOC, neck aching, 18 GU LAC NS infusing

## 2011-12-10 NOTE — Discharge Instructions (Signed)
Your blood tests do not show any significant illness.  Followup with your Dr. as needed.  Return for worse or uncontrolled symptoms

## 2011-12-10 NOTE — ED Notes (Signed)
Patient ambulated to bathroom without difficulty, denies pain.

## 2012-02-09 DIAGNOSIS — J449 Chronic obstructive pulmonary disease, unspecified: Secondary | ICD-10-CM | POA: Diagnosis not present

## 2012-02-09 DIAGNOSIS — M109 Gout, unspecified: Secondary | ICD-10-CM | POA: Diagnosis not present

## 2012-02-09 DIAGNOSIS — R079 Chest pain, unspecified: Secondary | ICD-10-CM | POA: Diagnosis not present

## 2012-02-09 DIAGNOSIS — I10 Essential (primary) hypertension: Secondary | ICD-10-CM | POA: Diagnosis not present

## 2012-03-05 ENCOUNTER — Encounter: Payer: Self-pay | Admitting: Gastroenterology

## 2012-03-05 ENCOUNTER — Ambulatory Visit (INDEPENDENT_AMBULATORY_CARE_PROVIDER_SITE_OTHER): Payer: Medicare Other | Admitting: Gastroenterology

## 2012-03-05 VITALS — BP 132/70 | HR 60 | Ht 70.0 in | Wt 176.6 lb

## 2012-03-05 DIAGNOSIS — R1310 Dysphagia, unspecified: Secondary | ICD-10-CM | POA: Insufficient documentation

## 2012-03-05 DIAGNOSIS — K625 Hemorrhage of anus and rectum: Secondary | ICD-10-CM

## 2012-03-05 DIAGNOSIS — R079 Chest pain, unspecified: Secondary | ICD-10-CM | POA: Diagnosis not present

## 2012-03-05 NOTE — Progress Notes (Signed)
History of Present Illness:  William Cisneros has returned for evaluation of dysphagia. He is complaining of intermittent dysphagia to solids. This has occurred over 15 years but clearly has worsened over the past several months. Dysphagia is mostly to solids although occasionally liquids. He has coughed up undigested food. Colonoscopy in October, 2012 for rectal bleeding demonstrated a cecal AVM, nonbleeding adenomatous polyp, and hemorrhoids. The bleeding was felt secondary to hemorrhoids.  He was treated with suppositories and has had no further bleeding.    Review of Systems: He complains of left chest pain after sitting and stretching or twisting upon arising. This has improved with NSAIDs. Pertinent positive and negative review of systems were noted in the above HPI section. All other review of systems were otherwise negative.    Current Medications, Allergies, Past Medical History, Past Surgical History, Family History and Social History were reviewed in [employer] electronic medical record  Vital signs were reviewed in today's medical record. Physical Exam: General: Well developed , well nourished, no acute distress Head: Normocephalic and atraumatic Eyes:  sclerae anicteric, EOMI Ears: Normal auditory acuity Mouth: No deformity or lesions Lungs: Clear throughout to auscultation Heart: Regular rate and rhythm; no murmurs, rubs or bruits Abdomen: Soft, non tender and non distended. No masses, hepatosplenomegaly or hernias noted. Normal Bowel sounds Rectal:deferred Musculoskeletal: Symmetrical with no gross deformities; there is no point tenderness to palpation over the chest Pulses:  Normal pulses noted Extremities: No clubbing, cyanosis, edema or deformities noted Neurological: Alert oriented x 4, grossly nonfocal Psychological:  Alert and cooperative. Normal mood and affect

## 2012-03-05 NOTE — Assessment & Plan Note (Signed)
I suspect that he has an esophageal stricture.  Recommendations #1 upper endoscopy with dilatation as indicated  Risks, alternatives, and complications of the procedure, including bleeding, perforation, and possible need for surgery, were explained to the patient.  Patient's questions were answered.

## 2012-03-05 NOTE — Patient Instructions (Signed)
Your endoscopy is scheduled on 03/06/2012 Separate instructions have been given

## 2012-03-05 NOTE — Assessment & Plan Note (Signed)
This is secondary to hemorrhoids which has resolved following suppository therapy

## 2012-03-06 ENCOUNTER — Ambulatory Visit (AMBULATORY_SURGERY_CENTER): Payer: Medicare Other | Admitting: Gastroenterology

## 2012-03-06 ENCOUNTER — Encounter: Payer: Self-pay | Admitting: Gastroenterology

## 2012-03-06 VITALS — BP 160/80 | HR 60 | Temp 97.0°F | Resp 24 | Ht 70.0 in | Wt 176.0 lb

## 2012-03-06 DIAGNOSIS — R1310 Dysphagia, unspecified: Secondary | ICD-10-CM | POA: Diagnosis not present

## 2012-03-06 DIAGNOSIS — R079 Chest pain, unspecified: Secondary | ICD-10-CM | POA: Insufficient documentation

## 2012-03-06 DIAGNOSIS — K222 Esophageal obstruction: Secondary | ICD-10-CM | POA: Diagnosis not present

## 2012-03-06 DIAGNOSIS — F411 Generalized anxiety disorder: Secondary | ICD-10-CM | POA: Diagnosis not present

## 2012-03-06 MED ORDER — SODIUM CHLORIDE 0.9 % IV SOLN: 500.0000 mL | INTRAVENOUS | Status: DC

## 2012-03-06 NOTE — Progress Notes (Signed)
Patient did not have preoperative order for IV antibiotic SSI prophylaxis. (G8918)  Patient did not experience any of the following events: a burn prior to discharge; a fall within the facility; wrong site/side/patient/procedure/implant event; or a hospital transfer or hospital admission upon discharge from the facility. (G8907)  

## 2012-03-06 NOTE — Assessment & Plan Note (Signed)
Very likely secondary to musculoskeletal pain.

## 2012-03-06 NOTE — Patient Instructions (Signed)
YOU HAD AN ENDOSCOPIC PROCEDURE TODAY AT THE [facility]: Refer to the procedure report that was given to you for any specific questions about what was found during the examination.  If the procedure report does not answer your questions, please call your gastroenterologist to clarify.  If you requested that your care partner not be given the details of your procedure findings, then the procedure report has been included in a sealed envelope for you to review at your convenience later.  YOU SHOULD EXPECT: Some feelings of bloating in the abdomen. Passage of more gas than usual.  Walking can help get rid of the air that was put into your GI tract during the procedure and reduce the bloating. If you had a lower endoscopy (such as a colonoscopy or flexible sigmoidoscopy) you may notice spotting of blood in your stool or on the toilet paper. If you underwent a bowel prep for your procedure, then you may not have a normal bowel movement for a few days.  DIET: Your first meal following the procedure should be a light meal and then it is ok to progress to your normal diet.  A half-sandwich or bowl of soup is an example of a good first meal.  Heavy or fried foods are harder to digest and may make you feel nauseous or bloated.  Likewise meals heavy in dairy and vegetables can cause extra gas to form and this can also increase the bloating.  Drink plenty of fluids but you should avoid alcoholic beverages for 24 hours.  ACTIVITY: Your care partner should take you home directly after the procedure.  You should plan to take it easy, moving slowly for the rest of the day.  You can resume normal activity the day after the procedure however you should NOT DRIVE or use heavy machinery for 24 hours (because of the sedation medicines used during the test).    SYMPTOMS TO REPORT IMMEDIATELY: A gastroenterologist can be reached at any hour.  During normal business hours, 8:30 AM to 5:00 PM Monday through Friday,  call [phone].  After hours and on weekends, please call the GI answering service at [phone] who will take a message and have the physician on call contact you.  Following upper endoscopy (EGD)  Vomiting of blood or coffee ground material  New chest pain or pain under the shoulder blades  Painful or persistently difficult swallowing  New shortness of breath  Fever of 100F or higher  Black, tarry-looking stools  FOLLOW UP: If any biopsies were taken you will be contacted by phone or by letter within the next 1-3 weeks.  Call your gastroenterologist if you have not heard about the biopsies in 3 weeks.  Our staff will call the home number listed on your records the next business day following your procedure to check on you and address any questions or concerns that you may have at that time regarding the information given to you following your procedure. This is a courtesy call and so if there is no answer at the home number and we have not heard from you through the emergency physician on call, we will assume that you have returned to your regular daily activities without incident.  SIGNATURES/CONFIDENTIALITY: You and/or your care partner have signed paperwork which will be entered into your electronic medical record.  These signatures attest to the fact that that the information above on your After Visit Summary has been reviewed and is understood.  Full responsibility of   the confidentiality of this discharge information lies with you and/or your care-partner. 

## 2012-03-06 NOTE — Op Note (Signed)
[facility] [address] [place], [place]  [address]  ENDOSCOPY PROCEDURE REPORT  PATIENT:  William Cisneros, William Cisneros  MR#:  [id number] BIRTHDATE:  [birth year], 87 yrs. old  GENDER:  male  ENDOSCOPIST:  Barbette Hair. Arlyce Dice, MD Referred by:  Margaretmary Bayley, M.D.  PROCEDURE DATE:  03/06/2012 PROCEDURE:  EGD, diagnostic 43235, Maloney Dilation of Esophagus ASA CLASS:  Class II INDICATIONS:  dysphagia  MEDICATIONS:   MAC sedation, administered by CRNA propofol 50mg IV, glycopyrrolate (Robinal) 0.2 mg IV, 0.6cc simethancone 0.6 cc PO TOPICAL ANESTHETIC:  DESCRIPTION OF PROCEDURE:   After the risks and benefits of the procedure were explained, informed consent was obtained.  The LB-GIF Q180 [id number] endoscope was introduced through the mouth and advanced to the third portion of the duodenum.  The instrument was slowly withdrawn as the mucosa was fully examined. <<PROCEDUREIMAGES>>  An early stricture was found at the gastroesophageal junction (see image3). Dilation with maloney dilator 18mm Mild resistance; no heme    Retroflexed views revealed no abnormalities.    The scope was then withdrawn from the patient and the procedure completed.  COMPLICATIONS:  None  ENDOSCOPIC IMPRESSION: 1) Stricture at the gastroesophageal junction - s/p maloney dilitation RECOMMENDATIONS: 1) continue PPI 2) OP follow-up is advised on a PRN basis.  ______________________________ Barbette Hair Arlyce Dice, MD  CC:  n. eSIGNED:   Barbette Hair. Lilo Wallington at 03/06/2012 04:00 PM  Aram Beecham, [phone]

## 2012-03-07 ENCOUNTER — Telehealth: Payer: Self-pay

## 2012-03-07 NOTE — Telephone Encounter (Signed)
Left message on answering machine. 

## 2012-03-22 DIAGNOSIS — I10 Essential (primary) hypertension: Secondary | ICD-10-CM | POA: Diagnosis not present

## 2012-07-24 DIAGNOSIS — M7150 Other bursitis, not elsewhere classified, unspecified site: Secondary | ICD-10-CM | POA: Diagnosis not present

## 2012-07-26 DIAGNOSIS — IMO0002 Reserved for concepts with insufficient information to code with codable children: Secondary | ICD-10-CM | POA: Diagnosis not present

## 2012-07-30 ENCOUNTER — Emergency Department (HOSPITAL_COMMUNITY)
Admission: EM | Admit: 2012-07-30 | Discharge: 2012-07-30 | Disposition: A | Discharge status: Home / Self Care | Payer: Medicare Other | Attending: Emergency Medicine | Admitting: Emergency Medicine

## 2012-07-30 ENCOUNTER — Encounter (HOSPITAL_COMMUNITY): Payer: Self-pay

## 2012-07-30 DIAGNOSIS — Z87891 Personal history of nicotine dependence: Secondary | ICD-10-CM | POA: Diagnosis not present

## 2012-07-30 DIAGNOSIS — B029 Zoster without complications: Secondary | ICD-10-CM | POA: Diagnosis not present

## 2012-07-30 DIAGNOSIS — Z7982 Long term (current) use of aspirin: Secondary | ICD-10-CM | POA: Insufficient documentation

## 2012-07-30 DIAGNOSIS — J449 Chronic obstructive pulmonary disease, unspecified: Secondary | ICD-10-CM | POA: Diagnosis not present

## 2012-07-30 DIAGNOSIS — J4489 Other specified chronic obstructive pulmonary disease: Secondary | ICD-10-CM | POA: Insufficient documentation

## 2012-07-30 DIAGNOSIS — I10 Essential (primary) hypertension: Secondary | ICD-10-CM | POA: Diagnosis not present

## 2012-07-30 DIAGNOSIS — Z79899 Other long term (current) drug therapy: Secondary | ICD-10-CM | POA: Diagnosis not present

## 2012-07-30 HISTORY — DX: Hypoglycemia, unspecified: E16.2

## 2012-07-30 MED ORDER — OXYCODONE-ACETAMINOPHEN 7.5-325 MG PO TABS: 1.0000 | ORAL_TABLET | ORAL | Status: DC | PRN

## 2012-07-30 MED ORDER — FAMCICLOVIR 500 MG PO TABS: 500.0000 mg | ORAL_TABLET | Freq: Three times a day (TID) | ORAL | Status: DC

## 2012-07-30 NOTE — ED Notes (Signed)
Patient reports having right calf pain that began 1 week ago. Patient went to an Urgent Care and has an appointment for an MRI in 4 days. Patient reports that the pain has now increased to the right hip and is now has difficulty walking and has a rash on the right shin area.

## 2012-07-30 NOTE — ED Provider Notes (Signed)
History     CSN: [id number]  Arrival date & time 07/30/12  8295   First MD Initiated Contact with Patient 07/30/12 0818      Chief Complaint  Patient presents with  . Hip Pain  . Leg Pain    (Consider location/radiation/quality/duration/timing/severity/associated sxs/prior treatment) HPI Patient reports having right calf pain that began 1 week ago. Patient went to an Urgent Care and has an appointment for an MRI in 4 days. Patient reports that the pain has now increased to the right hip and is now has difficulty walking and has a rash on the right shin area  Past Medical History  Diagnosis Date  . Hypertension   . Hyperglycemia     pt states that he has  "hypo" glycemia  . COPD (chronic obstructive pulmonary disease)   . Hypoglycemia     Past Surgical History  Procedure Date  . Hemorrhoid surgery 1958  . Vein ligation and stripping     rt leg  . Colonoscopy     Family History  Problem Relation Age of Onset  . Colon cancer Neg Hx   . Esophageal cancer Neg Hx   . Stomach cancer Neg Hx     History  Substance Use Topics  . Smoking status: Former Smoker    Quit date: 09/26/1966  . Smokeless tobacco: Never Used  . Alcohol Use: No      Review of Systems  All other systems reviewed and are negative.    Allergies  Review of patient's allergies indicates no known allergies.  Home Medications   Current Outpatient Rx  Name  Route  Sig  Dispense  Refill  . ASPIRIN 325 MG PO TABS   Oral   Take 325 mg by mouth daily.         . IBUPROFEN 200 MG PO TABS   Oral   Take 400 mg by mouth every 6 (six) hours as needed. Pain         . MELOXICAM 7.5 MG PO TABS   Oral   Take 7.5 mg by mouth daily.         Marland Kitchen METOPROLOL SUCCINATE ER 50 MG PO TB24   Oral   Take 50 mg by mouth daily.          Marland Kitchen OLMESARTAN-AMLODIPINE-HCTZ 40-5-12.5 MG PO TABS   Oral   Take by mouth.           . OMEPRAZOLE 20 MG PO CPDR   Oral   Take 20 mg by mouth daily.             Marland Kitchen FAMCICLOVIR 500 MG PO TABS   Oral   Take 1 tablet (500 mg total) by mouth 3 (three) times daily.   15 tablet   0   . OXYCODONE-ACETAMINOPHEN 7.5-325 MG PO TABS   Oral   Take 1 tablet by mouth every 4 (four) hours as needed for pain.   30 tablet   0     BP 134/72  Pulse 68  Temp 97.4 F (36.3 C) (Oral)  Resp 18  SpO2 98%  Physical Exam  Nursing note and vitals reviewed. Constitutional: He is oriented to person, place, and time. He appears well-developed and well-nourished. No distress.  HENT:  Head: Normocephalic and atraumatic.  Eyes: Pupils are equal, round, and reactive to light.  Neck: Normal range of motion.  Cardiovascular: Normal rate and intact distal pulses.   Pulmonary/Chest: No respiratory distress.  Abdominal: Normal appearance. He exhibits  no distension.  Musculoskeletal: Normal range of motion.  Neurological: He is alert and oriented to person, place, and time. No cranial nerve deficit.  Skin: Skin is warm and dry. Rash (zoster rash noted R buttock and leg) noted.  Psychiatric: He has a normal mood and affect. His behavior is normal.    ED Course  Procedures (including critical care time)  Labs Reviewed - No data to display No results found.   1. Shingles       MDM         Nelia Shi, MD 07/30/12 2056

## 2012-08-02 DIAGNOSIS — I10 Essential (primary) hypertension: Secondary | ICD-10-CM | POA: Diagnosis not present

## 2012-08-02 DIAGNOSIS — B029 Zoster without complications: Secondary | ICD-10-CM | POA: Diagnosis not present

## 2012-08-07 DIAGNOSIS — K219 Gastro-esophageal reflux disease without esophagitis: Secondary | ICD-10-CM | POA: Diagnosis not present

## 2012-08-07 DIAGNOSIS — Z23 Encounter for immunization: Secondary | ICD-10-CM | POA: Diagnosis not present

## 2012-08-07 DIAGNOSIS — K222 Esophageal obstruction: Secondary | ICD-10-CM | POA: Diagnosis not present

## 2012-08-07 DIAGNOSIS — B029 Zoster without complications: Secondary | ICD-10-CM | POA: Diagnosis not present

## 2012-08-16 ENCOUNTER — Emergency Department (HOSPITAL_COMMUNITY)
Admission: EM | Admit: 2012-08-16 | Discharge: 2012-08-16 | Disposition: A | Discharge status: Home / Self Care | Payer: Medicare Other | Attending: Emergency Medicine | Admitting: Emergency Medicine

## 2012-08-16 ENCOUNTER — Encounter (HOSPITAL_COMMUNITY): Payer: Self-pay | Admitting: *Deleted

## 2012-08-16 DIAGNOSIS — R404 Transient alteration of awareness: Secondary | ICD-10-CM | POA: Insufficient documentation

## 2012-08-16 DIAGNOSIS — Z79899 Other long term (current) drug therapy: Secondary | ICD-10-CM | POA: Diagnosis not present

## 2012-08-16 DIAGNOSIS — J4489 Other specified chronic obstructive pulmonary disease: Secondary | ICD-10-CM | POA: Insufficient documentation

## 2012-08-16 DIAGNOSIS — B029 Zoster without complications: Secondary | ICD-10-CM | POA: Diagnosis not present

## 2012-08-16 DIAGNOSIS — E119 Type 2 diabetes mellitus without complications: Secondary | ICD-10-CM | POA: Insufficient documentation

## 2012-08-16 DIAGNOSIS — Z7982 Long term (current) use of aspirin: Secondary | ICD-10-CM | POA: Diagnosis not present

## 2012-08-16 DIAGNOSIS — Z87891 Personal history of nicotine dependence: Secondary | ICD-10-CM | POA: Diagnosis not present

## 2012-08-16 DIAGNOSIS — R42 Dizziness and giddiness: Secondary | ICD-10-CM | POA: Diagnosis not present

## 2012-08-16 DIAGNOSIS — R55 Syncope and collapse: Secondary | ICD-10-CM | POA: Diagnosis not present

## 2012-08-16 DIAGNOSIS — R112 Nausea with vomiting, unspecified: Secondary | ICD-10-CM | POA: Diagnosis not present

## 2012-08-16 DIAGNOSIS — J449 Chronic obstructive pulmonary disease, unspecified: Secondary | ICD-10-CM | POA: Diagnosis not present

## 2012-08-16 DIAGNOSIS — I951 Orthostatic hypotension: Secondary | ICD-10-CM

## 2012-08-16 DIAGNOSIS — I10 Essential (primary) hypertension: Secondary | ICD-10-CM | POA: Diagnosis not present

## 2012-08-16 LAB — CBC
HCT: 41.9 % (ref 39.0–52.0)
MCV: 85.3 fL (ref 78.0–100.0)
RBC: 4.91 MIL/uL (ref 4.22–5.81)
WBC: 13 10*3/uL — ABNORMAL HIGH (ref 4.0–10.5)

## 2012-08-16 LAB — BASIC METABOLIC PANEL
BUN: 34 mg/dL — ABNORMAL HIGH (ref 6–23)
CO2: 28 mEq/L (ref 19–32)
Chloride: 97 mEq/L (ref 96–112)
Creatinine, Ser: 1.3 mg/dL (ref 0.50–1.35)

## 2012-08-16 MED ORDER — AMLODIPINE BESYLATE 5 MG PO TABS: 5.0000 mg | ORAL_TABLET | Freq: Every day | ORAL | Status: DC

## 2012-08-16 MED ORDER — SODIUM CHLORIDE 0.9 % IV SOLN
1000.0000 mL | Freq: Once | INTRAVENOUS | Status: AC
  Administered 2012-08-16: 1000 mL via INTRAVENOUS

## 2012-08-16 MED ORDER — OLMESARTAN MEDOXOMIL 40 MG PO TABS: 40.0000 mg | ORAL_TABLET | Freq: Every day | ORAL | Status: DC

## 2012-08-16 MED ORDER — SODIUM CHLORIDE 0.9 % IV SOLN: 1000.0000 mL | INTRAVENOUS | Status: DC

## 2012-08-16 NOTE — ED Notes (Signed)
Per [facility] pt was being evaluated at his Primary MD's office for shingles.  He reportedly was sitting in the waiting area and began to have severe nausea, dizziness, and "passed out in the chair".  No trauma from fall due to staff assist.  Per staff pt had a LOC of 1 min.  Pt is a/ox 3 now no distress noted.  On EMS arrival pt was noted to be lethargic but responsive.  On standing the pt. He began to have the nausea again.  Zofran administered IV.

## 2012-08-16 NOTE — ED Provider Notes (Signed)
History     CSN: [id number]  Arrival date & time 08/16/12  1634   First MD Initiated Contact with Patient 08/16/12 1700      Chief Complaint  Patient presents with  . Loss of Consciousness     The history is provided by the patient.   patient reports he was being seen at the primary care physician office for shingles of his right lower extremity that's been present for 3 weeks and is still causing some pain.  The patient went to have blood work obtained he felt lightheaded and has a continued walking down the hall he became more lightheaded.  He is syncopal episode in the office and then returned to baseline mental status.  He became nauseated and vomited once.  The difficulty with his speech.  No unilateral arm or leg weakness.  No seizure activity was noted by either the primary care physician or the patient's son who is present within the whole time.  His blood pressure when initially treated in the office after the single episode was 80/60.  Blood pressure now is normal.  He reports because of the shingles he did not have a lot to eat or drink today or last evening.  He denies dysuria.  His had no fevers or chills.  No chest pain shortness of breath.  No abdominal pain or nausea or vomiting.  No melena or hematochezia.  His symptoms are mild.  He feels much better at this time.  His only complaint at this time his generalized weakness.  He has no unilateral symptoms.  Past Medical History  Diagnosis Date  . Hypertension   . Hyperglycemia     pt states that he has  "hypo" glycemia  . COPD (chronic obstructive pulmonary disease)   . Hypoglycemia     Past Surgical History  Procedure Date  . Hemorrhoid surgery 1958  . Vein ligation and stripping     rt leg  . Colonoscopy     Family History  Problem Relation Age of Onset  . Colon cancer Neg Hx   . Esophageal cancer Neg Hx   . Stomach cancer Neg Hx     History  Substance Use Topics  . Smoking status: Former Smoker    Quit  date: 09/26/1966  . Smokeless tobacco: Never Used  . Alcohol Use: No      Review of Systems  Cardiovascular: Positive for syncope.  All other systems reviewed and are negative.    Allergies  Review of patient's allergies indicates no known allergies.  Home Medications   Current Outpatient Rx  Name  Route  Sig  Dispense  Refill  . ASPIRIN 325 MG PO TABS   Oral   Take 325 mg by mouth daily.         Marland Kitchen HYDROCORTISONE 1 % EX CREA   Topical   Apply 1 application topically daily as needed. For soreness from shingles on legs         . IBUPROFEN 200 MG PO TABS   Oral   Take 400 mg by mouth every 6 (six) hours as needed. For Pain         . METOPROLOL SUCCINATE ER 50 MG PO TB24   Oral   Take 50 mg by mouth daily.          . OXYCODONE-ACETAMINOPHEN 7.5-325 MG PO TABS   Oral   Take 0.5 tablets by mouth daily as needed. For pain         .  AMLODIPINE BESYLATE 5 MG PO TABS   Oral   Take 1 tablet (5 mg total) by mouth daily.   30 tablet   0   . OLMESARTAN MEDOXOMIL 40 MG PO TABS   Oral   Take 1 tablet (40 mg total) by mouth daily.   30 tablet   0     BP 120/53  Pulse 87  Temp 97.3 F (36.3 C) (Oral)  Resp 22  SpO2 94%  Physical Exam  Nursing note and vitals reviewed. Constitutional: He is oriented to person, place, and time. He appears well-developed and well-nourished.  HENT:  Head: Normocephalic and atraumatic.  Eyes: EOM are normal.  Neck: Normal range of motion.  Cardiovascular: Normal rate, regular rhythm, normal heart sounds and intact distal pulses.   Pulmonary/Chest: Effort normal and breath sounds normal. No respiratory distress.  Abdominal: Soft. He exhibits no distension. There is no tenderness.  Musculoskeletal: Normal range of motion.  Neurological: He is alert and oriented to person, place, and time.  Skin: Skin is warm and dry.       L3-4 dermatome on right with shingles.  This is healing without secondary signs of infection    Psychiatric: He has a normal mood and affect. Judgment normal.    ED Course  Procedures (including critical care time)  Labs Reviewed  CBC - Abnormal; Notable for the following:    WBC 13.0 (*)     All other components within normal limits  BASIC METABOLIC PANEL - Abnormal; Notable for the following:    Sodium 132 (*)     Potassium 6.0 (*)     BUN 34 (*)     GFR calc non Af Amer 48 (*)     GFR calc Af Amer 56 (*)     All other components within normal limits  POTASSIUM - Abnormal; Notable for the following:    Potassium 5.8 (*)     All other components within normal limits   No results found.   1. Orthostasis       MDM  8:59 PM Patient feels much better after IV fluids.  I suspect this is mild volume depletion.  I discussed his case with his primary care physician he recommends taking him off of the olmesartan/amlodipine/hydrochlorothiazide.  He recommends restarting the olmesartan and amlodipine without the HCTZ.  No chest pain or shortness of breath.  The patient will followup with his physician tomorrow if not improved.  Return to ER for any new or worsening symptoms.        Lyanne Co, MD 08/16/12 2102

## 2012-08-16 NOTE — H&P (Signed)
Entered in error

## 2012-08-16 NOTE — ED Notes (Signed)
Pts son states, "He said he just didn't need to pee today."

## 2012-08-20 DIAGNOSIS — I10 Essential (primary) hypertension: Secondary | ICD-10-CM | POA: Diagnosis not present

## 2012-09-04 DIAGNOSIS — Z125 Encounter for screening for malignant neoplasm of prostate: Secondary | ICD-10-CM | POA: Diagnosis not present

## 2012-09-04 DIAGNOSIS — I10 Essential (primary) hypertension: Secondary | ICD-10-CM | POA: Diagnosis not present

## 2012-09-04 DIAGNOSIS — E785 Hyperlipidemia, unspecified: Secondary | ICD-10-CM | POA: Diagnosis not present

## 2012-09-11 DIAGNOSIS — E875 Hyperkalemia: Secondary | ICD-10-CM | POA: Diagnosis not present

## 2012-09-11 DIAGNOSIS — I10 Essential (primary) hypertension: Secondary | ICD-10-CM | POA: Diagnosis not present

## 2012-09-11 DIAGNOSIS — Z0000 Encounter for general adult medical examination without abnormal findings: Secondary | ICD-10-CM | POA: Diagnosis not present

## 2012-09-11 DIAGNOSIS — B029 Zoster without complications: Secondary | ICD-10-CM | POA: Diagnosis not present

## 2012-09-11 DIAGNOSIS — Z23 Encounter for immunization: Secondary | ICD-10-CM | POA: Diagnosis not present

## 2012-11-29 DIAGNOSIS — B0229 Other postherpetic nervous system involvement: Secondary | ICD-10-CM | POA: Diagnosis not present

## 2012-11-29 DIAGNOSIS — G609 Hereditary and idiopathic neuropathy, unspecified: Secondary | ICD-10-CM | POA: Diagnosis not present

## 2013-03-12 DIAGNOSIS — I10 Essential (primary) hypertension: Secondary | ICD-10-CM | POA: Diagnosis not present

## 2013-03-12 DIAGNOSIS — R5383 Other fatigue: Secondary | ICD-10-CM | POA: Diagnosis not present

## 2013-03-12 DIAGNOSIS — IMO0002 Reserved for concepts with insufficient information to code with codable children: Secondary | ICD-10-CM | POA: Diagnosis not present

## 2013-03-12 DIAGNOSIS — R5381 Other malaise: Secondary | ICD-10-CM | POA: Diagnosis not present

## 2013-04-11 DIAGNOSIS — IMO0002 Reserved for concepts with insufficient information to code with codable children: Secondary | ICD-10-CM | POA: Diagnosis not present

## 2013-04-11 DIAGNOSIS — I10 Essential (primary) hypertension: Secondary | ICD-10-CM | POA: Diagnosis not present

## 2013-05-14 DIAGNOSIS — N480 Leukoplakia of penis: Secondary | ICD-10-CM | POA: Diagnosis not present

## 2013-05-14 DIAGNOSIS — I10 Essential (primary) hypertension: Secondary | ICD-10-CM | POA: Diagnosis not present

## 2013-05-23 DIAGNOSIS — H43399 Other vitreous opacities, unspecified eye: Secondary | ICD-10-CM | POA: Diagnosis not present

## 2013-05-23 DIAGNOSIS — H26499 Other secondary cataract, unspecified eye: Secondary | ICD-10-CM | POA: Diagnosis not present

## 2013-05-23 DIAGNOSIS — H3530 Unspecified macular degeneration: Secondary | ICD-10-CM | POA: Diagnosis not present

## 2013-06-04 DIAGNOSIS — I10 Essential (primary) hypertension: Secondary | ICD-10-CM | POA: Diagnosis not present

## 2013-06-17 DIAGNOSIS — H353190 Nonexudative age-related macular degeneration, unspecified eye, stage unspecified: Secondary | ICD-10-CM | POA: Diagnosis not present

## 2013-06-17 DIAGNOSIS — H43819 Vitreous degeneration, unspecified eye: Secondary | ICD-10-CM | POA: Diagnosis not present

## 2013-06-17 DIAGNOSIS — H35369 Drusen (degenerative) of macula, unspecified eye: Secondary | ICD-10-CM | POA: Diagnosis not present

## 2013-07-01 DIAGNOSIS — H35039 Hypertensive retinopathy, unspecified eye: Secondary | ICD-10-CM | POA: Diagnosis not present

## 2013-07-01 DIAGNOSIS — H5200 Hypermetropia, unspecified eye: Secondary | ICD-10-CM | POA: Diagnosis not present

## 2013-07-01 DIAGNOSIS — H353190 Nonexudative age-related macular degeneration, unspecified eye, stage unspecified: Secondary | ICD-10-CM | POA: Diagnosis not present

## 2013-07-01 DIAGNOSIS — I10 Essential (primary) hypertension: Secondary | ICD-10-CM | POA: Diagnosis not present

## 2013-09-03 ENCOUNTER — Emergency Department (HOSPITAL_COMMUNITY)
Admission: EM | Admit: 2013-09-03 | Discharge: 2013-09-03 | Disposition: A | Discharge status: Home / Self Care | Payer: Medicare Other | Attending: Emergency Medicine | Admitting: Emergency Medicine

## 2013-09-03 ENCOUNTER — Encounter (HOSPITAL_COMMUNITY): Payer: Self-pay | Admitting: Emergency Medicine

## 2013-09-03 DIAGNOSIS — Z79899 Other long term (current) drug therapy: Secondary | ICD-10-CM | POA: Insufficient documentation

## 2013-09-03 DIAGNOSIS — J449 Chronic obstructive pulmonary disease, unspecified: Secondary | ICD-10-CM | POA: Insufficient documentation

## 2013-09-03 DIAGNOSIS — Z87891 Personal history of nicotine dependence: Secondary | ICD-10-CM | POA: Diagnosis not present

## 2013-09-03 DIAGNOSIS — R040 Epistaxis: Secondary | ICD-10-CM | POA: Diagnosis not present

## 2013-09-03 DIAGNOSIS — I10 Essential (primary) hypertension: Secondary | ICD-10-CM | POA: Diagnosis not present

## 2013-09-03 DIAGNOSIS — J4489 Other specified chronic obstructive pulmonary disease: Secondary | ICD-10-CM | POA: Insufficient documentation

## 2013-09-03 DIAGNOSIS — Z862 Personal history of diseases of the blood and blood-forming organs and certain disorders involving the immune mechanism: Secondary | ICD-10-CM | POA: Insufficient documentation

## 2013-09-03 DIAGNOSIS — Z7982 Long term (current) use of aspirin: Secondary | ICD-10-CM | POA: Diagnosis not present

## 2013-09-03 DIAGNOSIS — Z8639 Personal history of other endocrine, nutritional and metabolic disease: Secondary | ICD-10-CM | POA: Insufficient documentation

## 2013-09-03 MED ORDER — OXYMETAZOLINE HCL 0.05 % NA SOLN
3.0000 | Freq: Once | NASAL | Status: AC
  Administered 2013-09-03: 3 via NASAL
  Filled 2013-09-03: qty 15

## 2013-09-03 NOTE — ED Notes (Signed)
Pt reports he has had a nosebleed for the past week intermittently, pt awoke an hour ago with a nosebleed that will not stop. Pt noted to have washcloth to nose applying pressure, denies taking any anticoagulant medications, states he takes medication for hypertension, denies pain, a&o x4.

## 2013-09-03 NOTE — ED Provider Notes (Signed)
TIME SEEN: 4:51 AM  CHIEF COMPLAINT: Epistaxis  HPI: Patient is an 79 year old male with a history of hypertension, COPD who presents the emergency department with one week of intermittent bleeding from his left nostril. Patient reports that 2:30 this morning his bleeding started again and has not stopped with direct pressure. He is not on any anticoagulation other than aspirin 325 mg once daily. Denies any trauma to the nose. Denies any recent URI symptoms. Denies placing any foreign bodies in his nose other than toilet paper. He states he is otherwise feeling well.  ROS: See HPI Constitutional: no fever  Eyes: no drainage  ENT: no runny nose   Cardiovascular:  no chest pain  Resp: no SOB  GI: no vomiting GU: no dysuria Integumentary: no rash  Allergy: no hives  Musculoskeletal: no leg swelling  Neurological: no slurred speech ROS otherwise negative  PAST MEDICAL HISTORY/PAST SURGICAL HISTORY:  Past Medical History  Diagnosis Date  . Hypertension   . Hyperglycemia     pt states that he has  "hypo" glycemia  . COPD (chronic obstructive pulmonary disease)   . Hypoglycemia     MEDICATIONS:  Prior to Admission medications   Medication Sig Start Date End Date Taking? Authorizing Provider  amLODipine (NORVASC) 5 MG tablet Take 1 tablet (5 mg total) by mouth daily. 08/16/12   Lyanne Co, MD  aspirin 325 MG tablet Take 325 mg by mouth daily.    Historical Provider, MD  hydrocortisone cream 1 % Apply 1 application topically daily as needed. For soreness from shingles on legs    Historical Provider, MD  ibuprofen (ADVIL,MOTRIN) 200 MG tablet Take 400 mg by mouth every 6 (six) hours as needed. For Pain    Historical Provider, MD  metoprolol (TOPROL-XL) 50 MG 24 hr tablet Take 50 mg by mouth daily.     Historical Provider, MD  olmesartan (BENICAR) 40 MG tablet Take 1 tablet (40 mg total) by mouth daily. 08/16/12   Lyanne Co, MD  oxyCODONE-acetaminophen (PERCOCET) 7.5-325 MG per  tablet Take 0.5 tablets by mouth daily as needed. For pain    Historical Provider, MD    ALLERGIES:  No Known Allergies  SOCIAL HISTORY:  History  Substance Use Topics  . Smoking status: Former Smoker    Quit date: 09/26/1966  . Smokeless tobacco: Never Used  . Alcohol Use: No    FAMILY HISTORY: Family History  Problem Relation Age of Onset  . Colon cancer Neg Hx   . Esophageal cancer Neg Hx   . Stomach cancer Neg Hx     EXAM: BP 138/64  Pulse 86  Resp 18  SpO2 98% CONSTITUTIONAL: Alert and oriented and responds appropriately to questions. Well-appearing; well-nourished HEAD: Normocephalic EYES: Conjunctivae clear, PERRL ENT: normal nose; patient has bleeding from the left nostril with active bleeding in the posterior oropharynx, no tonsillar hypertrophy or exudate NECK: Supple, no meningismus, no LAD  CARD: RRR; S1 and S2 appreciated; no murmurs, no clicks, no rubs, no gallops RESP: Normal chest excursion without splinting or tachypnea; breath sounds clear and equal bilaterally; no wheezes, no rhonchi, no rales,  ABD/GI: Normal bowel sounds; non-distended; soft, non-tender, no rebound, no guarding BACK:  The back appears normal and is non-tender to palpation, there is no CVA tenderness EXT: Normal ROM in all joints; non-tender to palpation; no edema; normal capillary refill; no cyanosis    SKIN: Normal color for age and race; warm NEURO: Moves all extremities equally PSYCH: The  patient's mood and manner are appropriate. Grooming and personal hygiene are appropriate.  MEDICAL DECISION MAKING: Patient here with nosebleed that started 2 hours prior to arrival. He is hemodynamically stable. Will use intranasal Afrin and direct pressure and reassess.  ED PROGRESS: Patient's nosebleed has stopped. We'll discharge home with bottle of Afrin. Given instructions for treatment if symptoms recur. Have given supportive care instructions. Patient and wife at bedside verbalize  understanding and are comfortable with plan.     Layla Maw Ward, DO 09/03/13 [id number]

## 2013-09-06 DIAGNOSIS — Z125 Encounter for screening for malignant neoplasm of prostate: Secondary | ICD-10-CM | POA: Diagnosis not present

## 2013-09-06 DIAGNOSIS — E785 Hyperlipidemia, unspecified: Secondary | ICD-10-CM | POA: Diagnosis not present

## 2013-09-06 DIAGNOSIS — I10 Essential (primary) hypertension: Secondary | ICD-10-CM | POA: Diagnosis not present

## 2013-09-07 ENCOUNTER — Emergency Department (HOSPITAL_COMMUNITY)
Admission: EM | Admit: 2013-09-07 | Discharge: 2013-09-07 | Disposition: A | Discharge status: Home / Self Care | Payer: Medicare Other | Attending: Emergency Medicine | Admitting: Emergency Medicine

## 2013-09-07 ENCOUNTER — Encounter (HOSPITAL_COMMUNITY): Payer: Self-pay | Admitting: Emergency Medicine

## 2013-09-07 DIAGNOSIS — Z87891 Personal history of nicotine dependence: Secondary | ICD-10-CM | POA: Diagnosis not present

## 2013-09-07 DIAGNOSIS — I10 Essential (primary) hypertension: Secondary | ICD-10-CM | POA: Insufficient documentation

## 2013-09-07 DIAGNOSIS — Z8639 Personal history of other endocrine, nutritional and metabolic disease: Secondary | ICD-10-CM | POA: Insufficient documentation

## 2013-09-07 DIAGNOSIS — R040 Epistaxis: Secondary | ICD-10-CM | POA: Diagnosis not present

## 2013-09-07 DIAGNOSIS — Z79899 Other long term (current) drug therapy: Secondary | ICD-10-CM | POA: Insufficient documentation

## 2013-09-07 DIAGNOSIS — Z7982 Long term (current) use of aspirin: Secondary | ICD-10-CM | POA: Diagnosis not present

## 2013-09-07 DIAGNOSIS — Z862 Personal history of diseases of the blood and blood-forming organs and certain disorders involving the immune mechanism: Secondary | ICD-10-CM | POA: Insufficient documentation

## 2013-09-07 DIAGNOSIS — J449 Chronic obstructive pulmonary disease, unspecified: Secondary | ICD-10-CM | POA: Diagnosis not present

## 2013-09-07 DIAGNOSIS — J4489 Other specified chronic obstructive pulmonary disease: Secondary | ICD-10-CM | POA: Insufficient documentation

## 2013-09-07 MED ORDER — OXYMETAZOLINE HCL 0.05 % NA SOLN
1.0000 | Freq: Once | NASAL | Status: AC
  Administered 2013-09-07: 1 via NASAL
  Filled 2013-09-07: qty 15

## 2013-09-07 NOTE — ED Provider Notes (Signed)
CSN: [id number]     Arrival date & time 09/07/13  1045 History   First MD Initiated Contact with Patient 09/07/13 1056     Chief Complaint  Patient presents with  . Epistaxis   (Consider location/radiation/quality/duration/timing/severity/associated sxs/prior Treatment) Patient is a 79 y.o. male presenting with nosebleeds. The history is provided by the patient.  Epistaxis Location:  L nare Severity:  Mild Duration:  2 hours Timing:  Intermittent Progression:  Improving Chronicity:  Recurrent Context comment:  Blowing nose Relieved by:  Nothing Worsened by:  Nothing tried Ineffective treatments: affrin at home, compression. Associated symptoms: no cough, no facial pain, no fever and no headaches     Past Medical History  Diagnosis Date  . Hypertension   . Hyperglycemia     pt states that he has  "hypo" glycemia  . COPD (chronic obstructive pulmonary disease)   . Hypoglycemia    Past Surgical History  Procedure Laterality Date  . Hemorrhoid surgery  1958  . Vein ligation and stripping      rt leg  . Colonoscopy     Family History  Problem Relation Age of Onset  . Colon cancer Neg Hx   . Esophageal cancer Neg Hx   . Stomach cancer Neg Hx    History  Substance Use Topics  . Smoking status: Former Smoker    Quit date: 09/26/1966  . Smokeless tobacco: Never Used  . Alcohol Use: No    Review of Systems  Constitutional: Negative for fever.  HENT: Positive for nosebleeds. Negative for drooling and rhinorrhea.   Eyes: Negative for pain.  Respiratory: Negative for cough and shortness of breath.   Cardiovascular: Negative for chest pain and leg swelling.  Gastrointestinal: Negative for nausea, vomiting, abdominal pain and diarrhea.  Genitourinary: Negative for dysuria and hematuria.  Musculoskeletal: Negative for gait problem and neck pain.  Skin: Negative for color change.  Neurological: Negative for numbness and headaches.  Hematological: Negative for  adenopathy.  Psychiatric/Behavioral: Negative for behavioral problems.  All other systems reviewed and are negative.    Allergies  Review of patient's allergies indicates no known allergies.  Home Medications   Current Outpatient Rx  Name  Route  Sig  Dispense  Refill  . aspirin 325 MG tablet   Oral   Take 325 mg by mouth daily.         Marland Kitchen ibuprofen (ADVIL,MOTRIN) 200 MG tablet   Oral   Take 400 mg by mouth every 6 (six) hours as needed. For Pain         . metoprolol (TOPROL-XL) 50 MG 24 hr tablet   Oral   Take 50 mg by mouth daily.          Marland Kitchen olmesartan (BENICAR) 40 MG tablet   Oral   Take 1 tablet (40 mg total) by mouth daily.   30 tablet   0    BP 151/82  Pulse 91  Resp 18  SpO2 95% Physical Exam  Nursing note and vitals reviewed. Constitutional: He is oriented to person, place, and time. He appears well-developed and well-nourished.  HENT:  Head: Normocephalic and atraumatic.  Right Ear: External ear normal.  Left Ear: External ear normal.  Nose: Nose normal.  Mouth/Throat: Oropharynx is clear and moist. No oropharyngeal exudate.  No active bleeding noted upon examination of the nares. Mildly irritated area of mucosal tissue in the left nare on the anterior septal area.  Normal appearing oropharynx.  Eyes: Conjunctivae and EOM are  normal. Pupils are equal, round, and reactive to light.  Neck: Normal range of motion. Neck supple.  Cardiovascular: Normal rate, regular rhythm, normal heart sounds and intact distal pulses.  Exam reveals no gallop and no friction rub.   No murmur heard. Pulmonary/Chest: Effort normal and breath sounds normal. No respiratory distress. He has no wheezes.  Abdominal: Soft. Bowel sounds are normal. He exhibits no distension. There is no tenderness. There is no rebound and no guarding.  Musculoskeletal: Normal range of motion. He exhibits no edema and no tenderness.  Neurological: He is alert and oriented to person, place, and  time.  Skin: Skin is warm and dry.  Psychiatric: He has a normal mood and affect. His behavior is normal.    ED Course  Procedures (including critical care time) Labs Review Labs Reviewed - No data to display Imaging Review No results found.  EKG Interpretation   None       MDM   1. Epistaxis    11:08 AM 79 y.o. male who presents with epistaxis. The patient was seen on 09/03/2013 for the same. He states that he has had some very mild intermittent bleeding since that time. He states that he blew his nose this morning and had bleeding from the left nature for approximately 2 hours. He states that the bleeding is mild. It appears that the bleeding has stopped on my exam. He is afebrile and vital signs are unremarkable here. Will apply Afrin, compression, and re-evaluate.   12:29 PM: Pt continues to appear well. Bleeding has stopped. Will provide bacitracin for nasal lubrication and recommend humidified air at home. Gave affrin.  I have discussed the diagnosis/risks/treatment options with the patient and believe the pt to be eligible for discharge home to follow-up with pcp as needed. We also discussed returning to the ED immediately if new or worsening sx occur. We discussed the sx which are most concerning (e.g., return of bleeding or worsening bleeding) that necessitate immediate return. Any new prescriptions provided to the patient are listed below.  New Prescriptions   No medications on file     Junius Argyle, MD 09/07/13 1531

## 2013-09-07 NOTE — ED Notes (Signed)
Bleeding appears to have stopped 

## 2013-09-07 NOTE — ED Notes (Signed)
Pt c/o nose bleed x 1 hr w/ hx of same.  Denies thinners.

## 2013-09-08 ENCOUNTER — Emergency Department (HOSPITAL_COMMUNITY)
Admission: EM | Admit: 2013-09-08 | Discharge: 2013-09-08 | Disposition: A | Discharge status: Home / Self Care | Payer: Medicare Other | Attending: Emergency Medicine | Admitting: Emergency Medicine

## 2013-09-08 ENCOUNTER — Encounter (HOSPITAL_COMMUNITY): Payer: Self-pay | Admitting: Emergency Medicine

## 2013-09-08 DIAGNOSIS — J449 Chronic obstructive pulmonary disease, unspecified: Secondary | ICD-10-CM | POA: Diagnosis not present

## 2013-09-08 DIAGNOSIS — J4489 Other specified chronic obstructive pulmonary disease: Secondary | ICD-10-CM | POA: Insufficient documentation

## 2013-09-08 DIAGNOSIS — I10 Essential (primary) hypertension: Secondary | ICD-10-CM | POA: Insufficient documentation

## 2013-09-08 DIAGNOSIS — Z7982 Long term (current) use of aspirin: Secondary | ICD-10-CM | POA: Diagnosis not present

## 2013-09-08 DIAGNOSIS — Z87891 Personal history of nicotine dependence: Secondary | ICD-10-CM | POA: Insufficient documentation

## 2013-09-08 DIAGNOSIS — Z79899 Other long term (current) drug therapy: Secondary | ICD-10-CM | POA: Insufficient documentation

## 2013-09-08 DIAGNOSIS — R040 Epistaxis: Secondary | ICD-10-CM | POA: Diagnosis not present

## 2013-09-08 MED ORDER — AMOXICILLIN-POT CLAVULANATE 875-125 MG PO TABS: 1.0000 | ORAL_TABLET | Freq: Two times a day (BID) | ORAL | Status: DC

## 2013-09-08 NOTE — ED Provider Notes (Signed)
CSN: [id number]     Arrival date & time 09/08/13  1809 History   First MD Initiated Contact with Patient 09/08/13 1838     Chief Complaint  Patient presents with  . Epistaxis   (Consider location/radiation/quality/duration/timing/severity/associated sxs/prior Treatment) Patient is a 79 y.o. male presenting with nosebleeds.  Epistaxis Location:  L nare Severity:  Moderate Duration:  2 hours Timing:  Intermittent Progression:  Worsening Chronicity:  Recurrent Context: not anticoagulants   Relieved by:  Applying pressure and vasoconstrictors Exacerbated by: unknown. Associated symptoms: no congestion, no cough, no fever and no sinus pain     Past Medical History  Diagnosis Date  . Hypertension   . Hyperglycemia     pt states that he has  "hypo" glycemia  . COPD (chronic obstructive pulmonary disease)   . Hypoglycemia    Past Surgical History  Procedure Laterality Date  . Hemorrhoid surgery  1958  . Vein ligation and stripping      rt leg  . Colonoscopy     Family History  Problem Relation Age of Onset  . Colon cancer Neg Hx   . Esophageal cancer Neg Hx   . Stomach cancer Neg Hx    History  Substance Use Topics  . Smoking status: Former Smoker    Quit date: 09/26/1966  . Smokeless tobacco: Never Used  . Alcohol Use: No    Review of Systems  Constitutional: Negative for fever.  HENT: Positive for nosebleeds. Negative for congestion.   Respiratory: Negative for cough and shortness of breath.   Cardiovascular: Negative for chest pain.  Gastrointestinal: Negative for nausea, vomiting, abdominal pain and diarrhea.  All other systems reviewed and are negative.    Allergies  Review of patient's allergies indicates no known allergies.  Home Medications   Current Outpatient Rx  Name  Route  Sig  Dispense  Refill  . aspirin 325 MG tablet   Oral   Take 325 mg by mouth daily.         . metoprolol (TOPROL-XL) 50 MG 24 hr tablet   Oral   Take 50 mg by mouth  daily.          Marland Kitchen olmesartan (BENICAR) 40 MG tablet   Oral   Take 1 tablet (40 mg total) by mouth daily.   30 tablet   0    BP 165/81  Pulse 80  Temp(Src) 97.7 F (36.5 C) (Axillary)  Resp 16  SpO2 95% Physical Exam  Nursing note and vitals reviewed. Constitutional: He is oriented to person, place, and time. He appears well-developed and well-nourished. No distress.  HENT:  Head: Normocephalic and atraumatic.  Mucosa irritation in left nare without active bleeding  Eyes: Conjunctivae are normal. No scleral icterus.  Neck: Neck supple.  Cardiovascular: Normal rate and intact distal pulses.   Pulmonary/Chest: Effort normal. No stridor. No respiratory distress.  Abdominal: Normal appearance. He exhibits no distension.  Neurological: He is alert and oriented to person, place, and time.  Skin: Skin is warm and dry. No rash noted.  Psychiatric: He has a normal mood and affect. His behavior is normal.    ED Course  EPISTAXIS MANAGEMENT Date/Time: 09/08/2013 9:08 PM Performed by: Blake Divine DAVID Authorized by: Blake Divine DAVID Consent: Verbal consent obtained. Risks and benefits: risks, benefits and alternatives were discussed Treatment site: left anterior Repair method: nasal balloon Post-procedure assessment: bleeding stopped Treatment complexity: simple Patient tolerance: Patient tolerated the procedure well with no immediate complications.   (including  critical care time) Labs Review Labs Reviewed - No data to display Imaging Review No results found.  EKG Interpretation   None       MDM   1. Epistaxis    Pt has been having recurrent nosebleeds for 1.5 years, but dramatically worse over past week.  This is third ED visit for similar.  Not bleeding on initial evaluation.    9:07 PM During ED course, he began bleeding slowly again.  Placed anterior packing due to control bleeding until he can follow up with ENT.  DC'd with augmentin for ppx due to  advanced age.  He tolerated packing well.    Candyce Churn, MD 09/08/13 [phone]

## 2013-09-08 NOTE — ED Notes (Addendum)
Pt complains of intermittent nosebleed for last week. Has been here several times for same. Pt states nose has been bleeding since discharge last night. States blood flow is slow. Pt has pressure on nose. No bleeding when guaze removed for a few seconds. Denies any gushes of blood. Denies blood thinners. Denies dizziness/lightheadedness. Pt states he has been taking afrin nose spray at home without relief

## 2013-09-11 DIAGNOSIS — J342 Deviated nasal septum: Secondary | ICD-10-CM | POA: Diagnosis not present

## 2013-09-11 DIAGNOSIS — R040 Epistaxis: Secondary | ICD-10-CM | POA: Diagnosis not present

## 2013-09-11 DIAGNOSIS — T171XXA Foreign body in nostril, initial encounter: Secondary | ICD-10-CM | POA: Diagnosis not present

## 2013-09-13 DIAGNOSIS — H9319 Tinnitus, unspecified ear: Secondary | ICD-10-CM | POA: Diagnosis not present

## 2013-09-13 DIAGNOSIS — IMO0002 Reserved for concepts with insufficient information to code with codable children: Secondary | ICD-10-CM | POA: Diagnosis not present

## 2013-09-13 DIAGNOSIS — K222 Esophageal obstruction: Secondary | ICD-10-CM | POA: Diagnosis not present

## 2013-09-13 DIAGNOSIS — I10 Essential (primary) hypertension: Secondary | ICD-10-CM | POA: Diagnosis not present

## 2013-09-13 DIAGNOSIS — Z0000 Encounter for general adult medical examination without abnormal findings: Secondary | ICD-10-CM | POA: Diagnosis not present

## 2013-09-13 DIAGNOSIS — R040 Epistaxis: Secondary | ICD-10-CM | POA: Diagnosis not present

## 2013-09-13 DIAGNOSIS — F068 Other specified mental disorders due to known physiological condition: Secondary | ICD-10-CM | POA: Diagnosis not present

## 2013-10-10 ENCOUNTER — Other Ambulatory Visit: Payer: Self-pay | Admitting: Internal Medicine

## 2013-10-10 DIAGNOSIS — R1310 Dysphagia, unspecified: Secondary | ICD-10-CM

## 2013-10-10 DIAGNOSIS — IMO0002 Reserved for concepts with insufficient information to code with codable children: Secondary | ICD-10-CM | POA: Diagnosis not present

## 2013-10-10 DIAGNOSIS — Z1331 Encounter for screening for depression: Secondary | ICD-10-CM | POA: Diagnosis not present

## 2013-10-10 DIAGNOSIS — F068 Other specified mental disorders due to known physiological condition: Secondary | ICD-10-CM | POA: Diagnosis not present

## 2013-10-11 ENCOUNTER — Ambulatory Visit
Admission: RE | Admit: 2013-10-11 | Discharge: 2013-10-11 | Disposition: A | Discharge status: Home / Self Care | Payer: Medicare Other | Source: Ambulatory Visit | Attending: Internal Medicine | Admitting: Internal Medicine

## 2013-10-11 DIAGNOSIS — R1310 Dysphagia, unspecified: Secondary | ICD-10-CM

## 2013-10-11 DIAGNOSIS — K2289 Other specified disease of esophagus: Secondary | ICD-10-CM | POA: Diagnosis not present

## 2013-10-11 DIAGNOSIS — K228 Other specified diseases of esophagus: Secondary | ICD-10-CM | POA: Diagnosis not present

## 2013-10-24 ENCOUNTER — Encounter: Payer: Self-pay | Admitting: Gastroenterology

## 2013-10-24 ENCOUNTER — Ambulatory Visit (INDEPENDENT_AMBULATORY_CARE_PROVIDER_SITE_OTHER): Payer: Medicare Other | Admitting: Gastroenterology

## 2013-10-24 VITALS — BP 122/60 | HR 78 | Ht 70.0 in | Wt 163.8 lb

## 2013-10-24 DIAGNOSIS — R1310 Dysphagia, unspecified: Secondary | ICD-10-CM

## 2013-10-24 NOTE — Assessment & Plan Note (Signed)
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  I explained to him that the definitive therapy is a myotomy although sometimes dilatation therapy may offer some relief.  He feels that his symptoms are minimal and does not wish to pursue any therapy at this time.  I did point out that aspiration was seen at his x-ray and that, should he develop recurrent pulmonary infections, it may be an indication to intervene therapeutically.

## 2013-10-24 NOTE — Progress Notes (Signed)
_                                                                                                                History of Present Illness: William Cisneros 80 year old white male referred for evaluation of dysphagia.  He's complaining of dysphagia to solids, especially pills.  He only has rare pyrosis.  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  Aspiration was seen.  The patient underwent dilation of a distal esophageal stricture in 2013.  He believes that dysphagia was improved following therapy.    Past Medical History  Diagnosis Date  . Hypertension   . Hyperglycemia     pt states that he has  "hypo" glycemia  . COPD (chronic obstructive pulmonary disease)   . Hypoglycemia    Past Surgical History  Procedure Laterality Date  . Hemorrhoid surgery  1958  . Vein ligation and stripping      rt leg  . Colonoscopy     family history is negative for Colon cancer, Esophageal cancer, and Stomach cancer. Current Outpatient Prescriptions  Medication Sig Dispense Refill  . amoxicillin-clavulanate (AUGMENTIN) 875-125 MG per tablet Take 1 tablet by mouth every 12 (twelve) hours.  10 tablet  0  . aspirin 325 MG tablet Take 325 mg by mouth daily.      . metoprolol (TOPROL-XL) 50 MG 24 hr tablet Take 50 mg by mouth daily.       Marland Kitchen olmesartan (BENICAR) 40 MG tablet Take 1 tablet (40 mg total) by mouth daily.  30 tablet  0  . [DISCONTINUED] Olmesartan-Amlodipine-HCTZ (TRIBENZOR) 40-5-12.5 MG TABS Take 1 tablet by mouth daily.        No current facility-administered medications for this visit.   Allergies as of 10/24/2013  . (No Known Allergies)    reports that he quit smoking about 47 years ago. He has never used smokeless tobacco. He reports that he does not drink alcohol or use illicit drugs.     Review of Systems: He is had persistent numbness of the right lower extremity since a  herpes zoster infection.  He also has intermittent cramping of both his hands Pertinent positive and negative review of systems were noted in the above HPI section. All other review of systems were otherwise negative.  Vital signs were reviewed in today's medical record Physical Exam: General: Well developed , well nourished, no acute distress Skin: anicteric Head: Normocephalic and atraumatic Eyes:  sclerae anicteric, EOMI Ears: Normal auditory acuity Mouth: No deformity or lesions Neck: Supple, no masses or thyromegaly Lungs: Clear throughout to auscultation Heart: Regular rate and rhythm; no murmurs, rubs or bruits Abdomen: Soft, non tender and non distended. No masses, hepatosplenomegaly or hernias noted. Normal Bowel sounds Rectal:deferred Musculoskeletal: Symmetrical with no gross deformities  Skin: No lesions on visible extremities Pulses:  Normal pulses noted Extremities: No clubbing, cyanosis, edema or deformities noted Neurological: Alert oriented x 4, grossly nonfocal Cervical Nodes:  No significant cervical adenopathy Inguinal Nodes: No significant inguinal adenopathy Psychological:  Alert and cooperative. Normal mood and affect  See Assessment and Plan under Problem List

## 2013-10-24 NOTE — Patient Instructions (Signed)
Follow up as needed

## 2014-01-20 DIAGNOSIS — IMO0002 Reserved for concepts with insufficient information to code with codable children: Secondary | ICD-10-CM | POA: Diagnosis not present

## 2014-01-20 DIAGNOSIS — L989 Disorder of the skin and subcutaneous tissue, unspecified: Secondary | ICD-10-CM | POA: Diagnosis not present

## 2014-01-30 DIAGNOSIS — N453 Epididymo-orchitis: Secondary | ICD-10-CM | POA: Diagnosis not present

## 2014-01-31 ENCOUNTER — Emergency Department (HOSPITAL_COMMUNITY)
Admission: EM | Admit: 2014-01-31 | Discharge: 2014-01-31 | Disposition: A | Discharge status: Home / Self Care | Payer: Medicare Other | Attending: Emergency Medicine | Admitting: Emergency Medicine

## 2014-01-31 ENCOUNTER — Encounter (HOSPITAL_COMMUNITY): Payer: Self-pay | Admitting: Emergency Medicine

## 2014-01-31 DIAGNOSIS — I10 Essential (primary) hypertension: Secondary | ICD-10-CM | POA: Diagnosis not present

## 2014-01-31 DIAGNOSIS — N498 Inflammatory disorders of other specified male genital organs: Secondary | ICD-10-CM | POA: Diagnosis not present

## 2014-01-31 DIAGNOSIS — N492 Inflammatory disorders of scrotum: Secondary | ICD-10-CM

## 2014-01-31 DIAGNOSIS — Z87891 Personal history of nicotine dependence: Secondary | ICD-10-CM | POA: Diagnosis not present

## 2014-01-31 DIAGNOSIS — Z79899 Other long term (current) drug therapy: Secondary | ICD-10-CM | POA: Diagnosis not present

## 2014-01-31 DIAGNOSIS — J449 Chronic obstructive pulmonary disease, unspecified: Secondary | ICD-10-CM | POA: Diagnosis not present

## 2014-01-31 DIAGNOSIS — J4489 Other specified chronic obstructive pulmonary disease: Secondary | ICD-10-CM | POA: Insufficient documentation

## 2014-01-31 DIAGNOSIS — R112 Nausea with vomiting, unspecified: Secondary | ICD-10-CM | POA: Diagnosis not present

## 2014-01-31 LAB — COMPREHENSIVE METABOLIC PANEL
ALBUMIN: 3.9 g/dL (ref 3.5–5.2)
ALK PHOS: 94 U/L (ref 39–117)
ALT: 17 U/L (ref 0–53)
AST: 22 U/L (ref 0–37)
BUN: 30 mg/dL — ABNORMAL HIGH (ref 6–23)
CO2: 22 mEq/L (ref 19–32)
Calcium: 9.1 mg/dL (ref 8.4–10.5)
Chloride: 101 mEq/L (ref 96–112)
Creatinine, Ser: 1.11 mg/dL (ref 0.50–1.35)
GFR calc Af Amer: 67 mL/min — ABNORMAL LOW (ref >90)
GFR calc non Af Amer: 58 mL/min — ABNORMAL LOW (ref >90)
GLUCOSE: 126 mg/dL — AB (ref 70–99)
Potassium: 4.5 mEq/L (ref 3.7–5.3)
SODIUM: 139 meq/L (ref 137–147)
TOTAL PROTEIN: 7.5 g/dL (ref 6.0–8.3)
Total Bilirubin: 0.4 mg/dL (ref 0.3–1.2)

## 2014-01-31 LAB — URINALYSIS, ROUTINE W REFLEX MICROSCOPIC
Bilirubin Urine: NEGATIVE
GLUCOSE, UA: NEGATIVE mg/dL
Hgb urine dipstick: NEGATIVE
Ketones, ur: 15 mg/dL — AB
LEUKOCYTES UA: NEGATIVE
Nitrite: NEGATIVE
PH: 6 (ref 5.0–8.0)
Protein, ur: NEGATIVE mg/dL
Specific Gravity, Urine: 1.017 (ref 1.005–1.030)
Urobilinogen, UA: 0.2 mg/dL (ref 0.0–1.0)

## 2014-01-31 LAB — CBC WITH DIFFERENTIAL/PLATELET
BASOS PCT: 0 % (ref 0–1)
Basophils Absolute: 0 10*3/uL (ref 0.0–0.1)
Eosinophils Absolute: 0 10*3/uL (ref 0.0–0.7)
Eosinophils Relative: 0 % (ref 0–5)
HCT: 44.6 % (ref 39.0–52.0)
Hemoglobin: 15 g/dL (ref 13.0–17.0)
LYMPHS ABS: 0.7 10*3/uL (ref 0.7–4.0)
Lymphocytes Relative: 7 % — ABNORMAL LOW (ref 12–46)
MCH: 29.2 pg (ref 26.0–34.0)
MCHC: 33.6 g/dL (ref 30.0–36.0)
MCV: 86.8 fL (ref 78.0–100.0)
Monocytes Absolute: 0.4 10*3/uL (ref 0.1–1.0)
Monocytes Relative: 4 % (ref 3–12)
NEUTROS PCT: 89 % — AB (ref 43–77)
Neutro Abs: 8.8 10*3/uL — ABNORMAL HIGH (ref 1.7–7.7)
Platelets: 253 10*3/uL (ref 150–400)
RBC: 5.14 MIL/uL (ref 4.22–5.81)
RDW: 15.1 % (ref 11.5–15.5)
WBC: 9.9 10*3/uL (ref 4.0–10.5)

## 2014-01-31 LAB — I-STAT CG4 LACTIC ACID, ED: Lactic Acid, Venous: 1.53 mmol/L (ref 0.5–2.2)

## 2014-01-31 LAB — LIPASE, BLOOD: Lipase: 60 U/L — ABNORMAL HIGH (ref 11–59)

## 2014-01-31 MED ORDER — ONDANSETRON HCL 4 MG/2ML IJ SOLN
4.0000 mg | Freq: Once | INTRAMUSCULAR | Status: AC
  Administered 2014-01-31: 4 mg via INTRAVENOUS
  Filled 2014-01-31: qty 2

## 2014-01-31 MED ORDER — SODIUM CHLORIDE 0.9 % IV BOLUS (SEPSIS)
500.0000 mL | Freq: Once | INTRAVENOUS | Status: AC
  Administered 2014-01-31: 500 mL via INTRAVENOUS

## 2014-01-31 MED ORDER — SULFAMETHOXAZOLE-TMP DS 800-160 MG PO TABS: 1.0000 | ORAL_TABLET | Freq: Two times a day (BID) | ORAL | Status: DC

## 2014-01-31 MED ORDER — SULFAMETHOXAZOLE-TMP DS 800-160 MG PO TABS
1.0000 | ORAL_TABLET | Freq: Once | ORAL | Status: AC
  Administered 2014-01-31: 1 via ORAL
  Filled 2014-01-31: qty 1

## 2014-01-31 NOTE — ED Provider Notes (Signed)
CSN: [id number]     Arrival date & time 01/31/14  1436 History   First MD Initiated Contact with Patient 01/31/14 1458     Chief Complaint  Patient presents with  . Emesis     (Consider location/radiation/quality/duration/timing/severity/associated sxs/prior Treatment) HPI Comments: Saw doctor yesterday for scrotal pain - given Cipro for infection and pain meds. Taken 3 doses of pain meds, two yesterday, one this morning. Taken 3 doses of his Cipro. Vomited about one hour after taking his pain meds today.  Patient is a 80 y.o. male presenting with vomiting. The history is provided by the patient.  Emesis Severity:  Mild Timing:  Intermittent Number of daily episodes:  2 Quality:  Stomach contents How soon after eating does vomiting occur:  1 hour Progression:  Unchanged Chronicity:  New Recent urination:  Normal Context: not self-induced   Relieved by:  Nothing Worsened by:  Nothing tried Associated symptoms: no abdominal pain, no chills and no diarrhea     Past Medical History  Diagnosis Date  . Hypertension   . Hyperglycemia     pt states that he has  "hypo" glycemia  . COPD (chronic obstructive pulmonary disease)   . Hypoglycemia    Past Surgical History  Procedure Laterality Date  . Hemorrhoid surgery  1958  . Vein ligation and stripping      rt leg  . Colonoscopy     Family History  Problem Relation Age of Onset  . Colon cancer Neg Hx   . Esophageal cancer Neg Hx   . Stomach cancer Neg Hx    History  Substance Use Topics  . Smoking status: Former Smoker    Quit date: 09/26/1966  . Smokeless tobacco: Never Used  . Alcohol Use: No    Review of Systems  Constitutional: Negative for fever and chills.  Respiratory: Negative for cough and shortness of breath.   Cardiovascular: Negative for chest pain and leg swelling.  Gastrointestinal: Positive for nausea and vomiting (twice today). Negative for abdominal pain and diarrhea.  All other systems reviewed and  are negative.     Allergies  Review of patient's allergies indicates no known allergies.  Home Medications   Prior to Admission medications   Medication Sig Start Date End Date Taking? Authorizing Provider  ciprofloxacin (CIPRO) 500 MG tablet Take 500 mg by mouth 2 (two) times daily. Filled on 01/30/14 and prescription for 14 days.   Yes Historical Provider, MD  HYDROcodone-acetaminophen (NORCO/VICODIN) 5-325 MG per tablet Take 0.5-1 tablets by mouth every 6 (six) hours as needed for moderate pain.   Yes Historical Provider, MD  ibuprofen (ADVIL,MOTRIN) 200 MG tablet Take 200 mg by mouth every 6 (six) hours as needed for mild pain.   Yes Historical Provider, MD  metoprolol (TOPROL-XL) 50 MG 24 hr tablet Take 50 mg by mouth daily.    Yes Historical Provider, MD  olmesartan (BENICAR) 40 MG tablet Take 1 tablet (40 mg total) by mouth daily. 08/16/12  Yes Hoy Morn, MD   BP 142/61  Pulse 78  Temp(Src) 96.5 F (35.8 C) (Oral)  Resp 18  SpO2 98% Physical Exam  Nursing note and vitals reviewed. Constitutional: He is oriented to person, place, and time. He appears well-developed and well-nourished. No distress.  HENT:  Head: Normocephalic and atraumatic.  Mouth/Throat: No oropharyngeal exudate.  Eyes: EOM are normal. Pupils are equal, round, and reactive to light.  Neck: Normal range of motion. Neck supple.  Cardiovascular: Normal rate and regular  rhythm.  Exam reveals no friction rub.   No murmur heard. Pulmonary/Chest: Effort normal and breath sounds normal. No respiratory distress. He has no wheezes. He has no rales.  Abdominal: He exhibits no distension. There is no tenderness. There is no rebound. Hernia confirmed negative in the right inguinal area and confirmed negative in the left inguinal area.  Genitourinary: Testes normal.  Scrotal redness with point tenderness in R anterior scrotum. No edema or induration. No fluctuance. No crepitus.  Testicles without frank tenderness.  No penile tenderness, redness.  Perineum without redness, discharge.  Musculoskeletal: Normal range of motion. He exhibits no edema.  Neurological: He is alert and oriented to person, place, and time.  Skin: He is not diaphoretic.    ED Course  Procedures (including critical care time) Labs Review Labs Reviewed  CBC WITH DIFFERENTIAL - Abnormal; Notable for the following:    Neutrophils Relative % 89 (*)    Neutro Abs 8.8 (*)    Lymphocytes Relative 7 (*)    All other components within normal limits  COMPREHENSIVE METABOLIC PANEL  LIPASE, BLOOD  URINALYSIS, ROUTINE W REFLEX MICROSCOPIC  I-STAT CG4 LACTIC ACID, ED    Imaging Review No results found.   EKG Interpretation None      MDM   Final diagnoses:  Cellulitis, scrotum    46M with hx of HTN presents with scrotal redness. Had one episode of this last month - cleared up with antibiotics. Has taken Cipro and pain meds since yesterday for this. Began vomiting today, had 2 episodes with persistent nausea. No fevers, no abdominal pain, no dysuria, no testicle pain. On exam, scrotum with concerns for cellulitis. Redness with flaking. No induration, no drainage, no fluctuance. No testicular pain. Perineum without signs of infection. Concern for scrotal cellulitis. Will talk to Urology once labs return.  Labs unremarkable. I spoke with Dr. Risa Grill about the patient. Will change antibiotic to Bactrim, give him Urology f/u. Gave strict return precautions. Patient stable for discharge.   Osvaldo Shipper, MD 01/31/14 [phone]

## 2014-01-31 NOTE — ED Notes (Signed)
He states he was seen by his doctor for a "painful bump" at right groin area yesterday, and was prescribed antibiotic and pain med.  He is here today with c/o vomited x 2 with persistent nausea.  He is in no distress; and is able to ambulate to his room (his insistence).

## 2014-01-31 NOTE — Discharge Instructions (Signed)
Cellulitis Cellulitis is an infection of the skin and the tissue beneath it. The infected area is usually red and tender. Cellulitis occurs most often in the arms and lower legs.  CAUSES  Cellulitis is caused by bacteria that enter the skin through cracks or cuts in the skin. The most common types of bacteria that cause cellulitis are Staphylococcus and Streptococcus. SYMPTOMS   Redness and warmth.  Swelling.  Tenderness or pain.  Fever. DIAGNOSIS  Your caregiver can usually determine what is wrong based on a physical exam. Blood tests may also be done. TREATMENT  Treatment usually involves taking an antibiotic medicine. HOME CARE INSTRUCTIONS   Take your antibiotics as directed. Finish them even if you start to feel better.  Keep the infected arm or leg elevated to reduce swelling.  Apply a warm cloth to the affected area up to 4 times per day to relieve pain.  Only take over-the-counter or prescription medicines for pain, discomfort, or fever as directed by your caregiver.  Keep all follow-up appointments as directed by your caregiver. SEEK MEDICAL CARE IF:   You notice red streaks coming from the infected area.  Your red area gets larger or turns dark in color.  Your bone or joint underneath the infected area becomes painful after the skin has healed.  Your infection returns in the same area or another area.  You notice a swollen bump in the infected area.  You develop new symptoms. SEEK IMMEDIATE MEDICAL CARE IF:   You have a fever.  You feel very sleepy.  You develop vomiting or diarrhea.  You have a general ill feeling (malaise) with muscle aches and pains. MAKE SURE YOU:   Understand these instructions.  Will watch your condition.  Will get help right away if you are not doing well or get worse. Document Released: 06/22/2005 Document Revised: 03/13/2012 Document Reviewed: 11/28/2011 [employer] Patient Information 2014 [employer].  

## 2014-02-07 DIAGNOSIS — N498 Inflammatory disorders of other specified male genital organs: Secondary | ICD-10-CM | POA: Diagnosis not present

## 2014-02-18 DIAGNOSIS — L989 Disorder of the skin and subcutaneous tissue, unspecified: Secondary | ICD-10-CM | POA: Diagnosis not present

## 2014-02-18 DIAGNOSIS — F068 Other specified mental disorders due to known physiological condition: Secondary | ICD-10-CM | POA: Diagnosis not present

## 2014-02-18 DIAGNOSIS — Z6825 Body mass index (BMI) 25.0-25.9, adult: Secondary | ICD-10-CM | POA: Diagnosis not present

## 2014-02-18 DIAGNOSIS — R5381 Other malaise: Secondary | ICD-10-CM | POA: Diagnosis not present

## 2014-02-18 DIAGNOSIS — R5383 Other fatigue: Secondary | ICD-10-CM | POA: Diagnosis not present

## 2014-02-18 DIAGNOSIS — I10 Essential (primary) hypertension: Secondary | ICD-10-CM | POA: Diagnosis not present

## 2014-02-24 DIAGNOSIS — L821 Other seborrheic keratosis: Secondary | ICD-10-CM | POA: Diagnosis not present

## 2014-02-24 DIAGNOSIS — L570 Actinic keratosis: Secondary | ICD-10-CM | POA: Diagnosis not present

## 2014-03-03 DIAGNOSIS — L259 Unspecified contact dermatitis, unspecified cause: Secondary | ICD-10-CM | POA: Diagnosis not present

## 2014-03-03 DIAGNOSIS — L0390 Cellulitis, unspecified: Secondary | ICD-10-CM | POA: Diagnosis not present

## 2014-03-03 DIAGNOSIS — IMO0002 Reserved for concepts with insufficient information to code with codable children: Secondary | ICD-10-CM | POA: Diagnosis not present

## 2014-03-03 DIAGNOSIS — L0291 Cutaneous abscess, unspecified: Secondary | ICD-10-CM | POA: Diagnosis not present

## 2014-03-12 DIAGNOSIS — L259 Unspecified contact dermatitis, unspecified cause: Secondary | ICD-10-CM | POA: Diagnosis not present

## 2014-04-03 DIAGNOSIS — I10 Essential (primary) hypertension: Secondary | ICD-10-CM | POA: Diagnosis not present

## 2014-04-03 DIAGNOSIS — N476 Balanoposthitis: Secondary | ICD-10-CM | POA: Diagnosis not present

## 2014-04-03 DIAGNOSIS — L0291 Cutaneous abscess, unspecified: Secondary | ICD-10-CM | POA: Diagnosis not present

## 2014-04-03 DIAGNOSIS — IMO0002 Reserved for concepts with insufficient information to code with codable children: Secondary | ICD-10-CM | POA: Diagnosis not present

## 2014-04-03 DIAGNOSIS — L0390 Cellulitis, unspecified: Secondary | ICD-10-CM | POA: Diagnosis not present

## 2014-04-05 DIAGNOSIS — T2220XA Burn of second degree of shoulder and upper limb, except wrist and hand, unspecified site, initial encounter: Secondary | ICD-10-CM | POA: Diagnosis not present

## 2014-04-07 DIAGNOSIS — T23069A Burn of unspecified degree of back of unspecified hand, initial encounter: Secondary | ICD-10-CM | POA: Diagnosis not present

## 2014-04-07 DIAGNOSIS — L259 Unspecified contact dermatitis, unspecified cause: Secondary | ICD-10-CM | POA: Diagnosis not present

## 2014-04-07 DIAGNOSIS — IMO0002 Reserved for concepts with insufficient information to code with codable children: Secondary | ICD-10-CM | POA: Diagnosis not present

## 2014-05-05 ENCOUNTER — Encounter (HOSPITAL_COMMUNITY): Payer: Self-pay | Admitting: Emergency Medicine

## 2014-05-05 ENCOUNTER — Other Ambulatory Visit: Payer: Self-pay

## 2014-05-05 ENCOUNTER — Emergency Department (HOSPITAL_COMMUNITY)
Admission: EM | Admit: 2014-05-05 | Discharge: 2014-05-06 | Disposition: A | Discharge status: Home / Self Care | Payer: Medicare Other | Attending: Emergency Medicine | Admitting: Emergency Medicine

## 2014-05-05 DIAGNOSIS — J449 Chronic obstructive pulmonary disease, unspecified: Secondary | ICD-10-CM | POA: Insufficient documentation

## 2014-05-05 DIAGNOSIS — J4489 Other specified chronic obstructive pulmonary disease: Secondary | ICD-10-CM | POA: Diagnosis not present

## 2014-05-05 DIAGNOSIS — Z862 Personal history of diseases of the blood and blood-forming organs and certain disorders involving the immune mechanism: Secondary | ICD-10-CM | POA: Diagnosis not present

## 2014-05-05 DIAGNOSIS — Z87891 Personal history of nicotine dependence: Secondary | ICD-10-CM | POA: Diagnosis not present

## 2014-05-05 DIAGNOSIS — Z8639 Personal history of other endocrine, nutritional and metabolic disease: Secondary | ICD-10-CM | POA: Insufficient documentation

## 2014-05-05 DIAGNOSIS — R112 Nausea with vomiting, unspecified: Secondary | ICD-10-CM | POA: Diagnosis not present

## 2014-05-05 DIAGNOSIS — R5381 Other malaise: Secondary | ICD-10-CM | POA: Diagnosis not present

## 2014-05-05 DIAGNOSIS — I10 Essential (primary) hypertension: Secondary | ICD-10-CM | POA: Diagnosis not present

## 2014-05-05 DIAGNOSIS — I4510 Unspecified right bundle-branch block: Secondary | ICD-10-CM | POA: Diagnosis not present

## 2014-05-05 DIAGNOSIS — Z79899 Other long term (current) drug therapy: Secondary | ICD-10-CM | POA: Diagnosis not present

## 2014-05-05 DIAGNOSIS — R5383 Other fatigue: Secondary | ICD-10-CM

## 2014-05-05 LAB — COMPREHENSIVE METABOLIC PANEL
ALT: 15 U/L (ref 0–53)
AST: 19 U/L (ref 0–37)
Albumin: 3.6 g/dL (ref 3.5–5.2)
Alkaline Phosphatase: 74 U/L (ref 39–117)
Anion gap: 14 (ref 5–15)
BUN: 34 mg/dL — ABNORMAL HIGH (ref 6–23)
CO2: 21 mEq/L (ref 19–32)
Calcium: 9.1 mg/dL (ref 8.4–10.5)
Chloride: 101 mEq/L (ref 96–112)
Creatinine, Ser: 1.24 mg/dL (ref 0.50–1.35)
GFR calc Af Amer: 58 mL/min — ABNORMAL LOW (ref >90)
GFR calc non Af Amer: 50 mL/min — ABNORMAL LOW (ref >90)
Glucose, Bld: 124 mg/dL — ABNORMAL HIGH (ref 70–99)
Potassium: 4.2 mEq/L (ref 3.7–5.3)
Sodium: 136 mEq/L — ABNORMAL LOW (ref 137–147)
Total Bilirubin: 0.3 mg/dL (ref 0.3–1.2)
Total Protein: 6.7 g/dL (ref 6.0–8.3)

## 2014-05-05 LAB — CBC WITH DIFFERENTIAL/PLATELET
Basophils Absolute: 0 10*3/uL (ref 0.0–0.1)
Basophils Relative: 0 % (ref 0–1)
Eosinophils Absolute: 0.1 10*3/uL (ref 0.0–0.7)
Eosinophils Relative: 1 % (ref 0–5)
HCT: 39.2 % (ref 39.0–52.0)
Hemoglobin: 13.1 g/dL (ref 13.0–17.0)
Lymphocytes Relative: 9 % — ABNORMAL LOW (ref 12–46)
Lymphs Abs: 0.7 10*3/uL (ref 0.7–4.0)
MCH: 29.1 pg (ref 26.0–34.0)
MCHC: 33.4 g/dL (ref 30.0–36.0)
MCV: 87.1 fL (ref 78.0–100.0)
Monocytes Absolute: 0.3 10*3/uL (ref 0.1–1.0)
Monocytes Relative: 4 % (ref 3–12)
Neutro Abs: 7 10*3/uL (ref 1.7–7.7)
Neutrophils Relative %: 86 % — ABNORMAL HIGH (ref 43–77)
Platelets: 256 10*3/uL (ref 150–400)
RBC: 4.5 MIL/uL (ref 4.22–5.81)
RDW: 15.4 % (ref 11.5–15.5)
WBC: 8 10*3/uL (ref 4.0–10.5)

## 2014-05-05 LAB — CBG MONITORING, ED: GLUCOSE-CAPILLARY: 105 mg/dL — AB (ref 70–99)

## 2014-05-05 LAB — TROPONIN I

## 2014-05-05 MED ORDER — SODIUM CHLORIDE 0.9 % IV BOLUS (SEPSIS): 1000.0000 mL | Freq: Once | INTRAVENOUS | Status: DC

## 2014-05-05 MED ORDER — ONDANSETRON HCL 4 MG/2ML IJ SOLN
4.0000 mg | Freq: Once | INTRAMUSCULAR | Status: AC
  Administered 2014-05-05: 4 mg via INTRAVENOUS
  Filled 2014-05-05: qty 2

## 2014-05-05 MED ORDER — SODIUM CHLORIDE 0.9 % IV BOLUS (SEPSIS)
1000.0000 mL | Freq: Once | INTRAVENOUS | Status: AC
  Administered 2014-05-05: 1000 mL via INTRAVENOUS

## 2014-05-05 NOTE — ED Notes (Signed)
Pt given PO fluids and a urinal

## 2014-05-05 NOTE — ED Notes (Signed)
Jarrett Soho, PA at bedside speaking to pt and family at this time

## 2014-05-05 NOTE — ED Notes (Addendum)
Pt states he was working in his neighbors yard several hours ago and began to feel hot and weak. Pt states he was running a fever and decided to come to the ER because he didn't feel right. Triage temp 97.6 Per pt., he began throwing up 1 hour ago.  Pt states his last BM was today and is what he would consider diarrhea.   Pt is a vague historian. He presented with a medication he took an hour ago that he took to see if it would make him feel better. It is donepezil hcl (aricept odt) but denies history of dementia or alzheimer's.

## 2014-05-05 NOTE — Discharge Instructions (Signed)
1. Medications: usual home medications 2. Treatment: rest, drink plenty of fluids,  3. Follow Up: Please followup with your primary doctor for discussion of your diagnoses and further evaluation after today's visit; if you do not have a primary care doctor use the resource guide provided to find one;     Nausea and Vomiting Nausea is a sick feeling that often comes before throwing up (vomiting). Vomiting is a reflex where stomach contents come out of your mouth. Vomiting can cause severe loss of body fluids (dehydration). Children and elderly adults can become dehydrated quickly, especially if they also have diarrhea. Nausea and vomiting are symptoms of a condition or disease. It is important to find the cause of your symptoms. CAUSES   Direct irritation of the stomach lining. This irritation can result from increased acid production (gastroesophageal reflux disease), infection, food poisoning, taking certain medicines (such as nonsteroidal anti-inflammatory drugs), alcohol use, or tobacco use.  Signals from the brain.These signals could be caused by a headache, heat exposure, an inner ear disturbance, increased pressure in the brain from injury, infection, a tumor, or a concussion, pain, emotional stimulus, or metabolic problems.  An obstruction in the gastrointestinal tract (bowel obstruction).  Illnesses such as diabetes, hepatitis, gallbladder problems, appendicitis, kidney problems, cancer, sepsis, atypical symptoms of a heart attack, or eating disorders.  Medical treatments such as chemotherapy and radiation.  Receiving medicine that makes you sleep (general anesthetic) during surgery. DIAGNOSIS Your caregiver may ask for tests to be done if the problems do not improve after a few days. Tests may also be done if symptoms are severe or if the reason for the nausea and vomiting is not clear. Tests may include:  Urine tests.  Blood tests.  Stool tests.  Cultures (to look for  evidence of infection).  X-rays or other imaging studies. Test results can help your caregiver make decisions about treatment or the need for additional tests. TREATMENT You need to stay well hydrated. Drink frequently but in small amounts.You may wish to drink water, sports drinks, clear broth, or eat frozen ice pops or gelatin dessert to help stay hydrated.When you eat, eating slowly may help prevent nausea.There are also some antinausea medicines that may help prevent nausea. HOME CARE INSTRUCTIONS   Take all medicine as directed by your caregiver.  If you do not have an appetite, do not force yourself to eat. However, you must continue to drink fluids.  If you have an appetite, eat a normal diet unless your caregiver tells you differently.  Eat a variety of complex carbohydrates (rice, wheat, potatoes, bread), lean meats, yogurt, fruits, and vegetables.  Avoid high-fat foods because they are more difficult to digest.  Drink enough water and fluids to keep your urine clear or pale yellow.  If you are dehydrated, ask your caregiver for specific rehydration instructions. Signs of dehydration may include:  Severe thirst.  Dry lips and mouth.  Dizziness.  Dark urine.  Decreasing urine frequency and amount.  Confusion.  Rapid breathing or pulse. SEEK IMMEDIATE MEDICAL CARE IF:   You have blood or brown flecks (like coffee grounds) in your vomit.  You have black or bloody stools.  You have a severe headache or stiff neck.  You are confused.  You have severe abdominal pain.  You have chest pain or trouble breathing.  You do not urinate at least once every 8 hours.  You develop cold or clammy skin.  You continue to vomit for longer than 24 to 48  hours.  You have a fever. MAKE SURE YOU:   Understand these instructions.  Will watch your condition.  Will get help right away if you are not doing well or get worse. Document Released: 09/12/2005 Document  Revised: 12/05/2011 Document Reviewed: 02/09/2011 [facility] Patient Information 2015 [place], [place]. This information is not intended to replace advice given to you by your health care provider. Make sure you discuss any questions you have with your health care provider.

## 2014-05-05 NOTE — ED Provider Notes (Signed)
Medical screening examination/treatment/procedure(s) were conducted as a shared visit with non-physician practitioner(s) and myself.  I personally evaluated the patient during the encounter.   EKG Interpretation None     89yM with n/v after yard work earlier today. Apparently has had similar symptoms with previous over exertion. On my exam he appears in NAD. Pleasant and conversive. Heart RRR. No murmur appreciated. Abd soft/NT. Currently symptoms resolved. Drank water in ED w/o complaint. W/u fairly unremarkable. Afebrile. Son at bedside and reports pt is at baseline mental status. He would like to go home and I think he is appropriate to do so. Encouraged to drink plenty of fluids and not over exert himself. Discussed return precautions.   EKG:  Rhythm: normal sinus Rate: 88 RBBB 1st degree AV block QRS: 137 ms QTc: 429 ms ST segments: NS ST changes   Virgel Manifold, MD 05/06/14 [phone]

## 2014-05-05 NOTE — ED Provider Notes (Signed)
CSN: [id number]     Arrival date & time 05/05/14  1752 History   First MD Initiated Contact with Patient 05/05/14 1922     Chief Complaint  Patient presents with  . Nausea  . Emesis     (Consider location/radiation/quality/duration/timing/severity/associated sxs/prior Treatment) The history is provided by the patient and medical records. No language interpreter was used.    William Cisneros is a 80 y.o. male  with a hx of HTN, hyperglycemia, COPD  presents to the Emergency Department complaining of gradual, persistent, progressively worsening N/V onset 6PM after working in the yard today.  He reports this is normal for him to spend this much time in the yard.  Son reports he normally works in the yard taking breaks every 30 minutes or so but sometimes forgets to do so when it isn't hot. Patient reports he worked for 3-4 hours today without taking a break.   He denies headache, neck pain, chest pain, shortness of breath, abdominal pain, syncope, dysuria.  Patient reports he had one bowel movement today and it was loose.  Past Medical History  Diagnosis Date  . Hypertension   . Hyperglycemia     pt states that he has  "hypo" glycemia  . COPD (chronic obstructive pulmonary disease)   . Hypoglycemia    Past Surgical History  Procedure Laterality Date  . Hemorrhoid surgery  1958  . Vein ligation and stripping      rt leg  . Colonoscopy     Family History  Problem Relation Age of Onset  . Colon cancer Neg Hx   . Esophageal cancer Neg Hx   . Stomach cancer Neg Hx    History  Substance Use Topics  . Smoking status: Former Smoker    Quit date: 09/26/1966  . Smokeless tobacco: Never Used  . Alcohol Use: Yes     Comment: occassional    Review of Systems  Constitutional: Negative for fever, diaphoresis, appetite change, fatigue and unexpected weight change.  HENT: Negative for mouth sores.   Eyes: Negative for visual disturbance.  Respiratory: Negative for cough, chest tightness,  shortness of breath and wheezing.   Cardiovascular: Negative for chest pain.  Gastrointestinal: Positive for nausea and vomiting. Negative for abdominal pain, diarrhea and constipation.  Endocrine: Negative for polydipsia, polyphagia and polyuria.  Genitourinary: Negative for dysuria, urgency, frequency and hematuria.  Musculoskeletal: Negative for back pain and neck stiffness.  Skin: Negative for rash.  Allergic/Immunologic: Negative for immunocompromised state.  Neurological: Positive for weakness. Negative for syncope, facial asymmetry, light-headedness and headaches.  Hematological: Does not bruise/bleed easily.  Psychiatric/Behavioral: Negative for sleep disturbance. The patient is not nervous/anxious.       Allergies  Review of patient's allergies indicates no known allergies.  Home Medications   Prior to Admission medications   Medication Sig Start Date End Date Taking? Authorizing Provider  ibuprofen (ADVIL,MOTRIN) 200 MG tablet Take 200 mg by mouth every 6 (six) hours as needed for mild pain.   Yes Historical Provider, MD  metoprolol (TOPROL-XL) 50 MG 24 hr tablet Take 50 mg by mouth daily.    Yes Historical Provider, MD  olmesartan (BENICAR) 40 MG tablet Take 1 tablet (40 mg total) by mouth daily. 08/16/12  Yes Hoy Morn, MD   BP 153/70  Pulse 74  Temp(Src) 96.4 F (35.8 C) (Rectal)  Resp 18  Ht 5\' 10"  (1.778 m)  Wt 160 lb (72.576 kg)  BMI 22.96 kg/m2  SpO2 97% Physical Exam  Nursing note and vitals reviewed. Constitutional: He is oriented to person, place, and time. He appears well-developed and well-nourished. No distress.  Awake, alert, nontoxic appearance  HENT:  Head: Normocephalic and atraumatic.  Mouth/Throat: Oropharynx is clear and moist. No oropharyngeal exudate.  Eyes: Conjunctivae and EOM are normal. Pupils are equal, round, and reactive to light. No scleral icterus.  No horizontal, vertical or rotational nystagmus  Neck: Normal range of motion.  Neck supple.  Full active and passive ROM without pain No midline or paraspinal tenderness No nuchal rigidity or meningeal signs  Cardiovascular: Normal rate, regular rhythm, normal heart sounds and intact distal pulses.   Pulmonary/Chest: Effort normal and breath sounds normal. No respiratory distress. He has no wheezes. He has no rales.  Equal chest expansion Clear and equal breath sounds  Abdominal: Soft. Bowel sounds are normal. He exhibits no mass. There is no tenderness. There is no rebound and no guarding.  Abdomen soft and nontender  Musculoskeletal: Normal range of motion. He exhibits no edema.  Lymphadenopathy:    He has no cervical adenopathy.  Neurological: He is alert and oriented to person, place, and time. He has normal reflexes. No cranial nerve deficit. He exhibits normal muscle tone. Coordination normal.  Mental Status:  Alert, oriented, thought content appropriate. Speech fluent without evidence of aphasia. Able to follow 2 step commands without difficulty.  Cranial Nerves:  II:  Peripheral visual fields grossly normal, pupils equal, round, reactive to light III,IV, VI: ptosis not present, extra-ocular motions intact bilaterally  V,VII: smile symmetric, facial light touch sensation equal VIII: hearing grossly normal bilaterally  IX,X: gag reflex present  XI: bilateral shoulder shrug equal and strong XII: midline tongue extension  Motor:  5/5 in upper and lower extremities bilaterally including strong and equal grip strength and dorsiflexion/plantar flexion Sensory: Pinprick and light touch normal in all extremities.  Deep Tendon Reflexes: 2+ and symmetric  Cerebellar: normal finger-to-nose with bilateral upper extremities Gait: normal gait and balance CV: distal pulses palpable throughout   Skin: Skin is warm and dry. No rash noted. He is not diaphoretic.  Psychiatric: He has a normal mood and affect. His behavior is normal. Judgment and thought content normal.     ED Course  Procedures (including critical care time) Labs Review Labs Reviewed  CBC WITH DIFFERENTIAL - Abnormal; Notable for the following:    Neutrophils Relative % 86 (*)    Lymphocytes Relative 9 (*)    All other components within normal limits  COMPREHENSIVE METABOLIC PANEL - Abnormal; Notable for the following:    Sodium 136 (*)    Glucose, Bld 124 (*)    BUN 34 (*)    GFR calc non Af Amer 50 (*)    GFR calc Af Amer 58 (*)    All other components within normal limits  CBG MONITORING, ED - Abnormal; Notable for the following:    Glucose-Capillary 105 (*)    All other components within normal limits  TROPONIN I  URINALYSIS, ROUTINE W REFLEX MICROSCOPIC    Imaging Review No results found.   EKG Interpretation None      ECG:  Date: 05/06/2014  Rate: 88  Rhythm: normal sinus rhythm  QRS Axis: right  Intervals: normal  ST/T Wave abnormalities: nonspecific ST/T changes  Conduction Disutrbances:right bundle branch block  Narrative Interpretation: RRRB  Old EKG Reviewed: none available     MDM   Final diagnoses:  Non-intractable vomiting with nausea, vomiting of unspecified type   Belenda Cruise  [place] presents for nausea and vomiting which patient and family attribute to over exertion and potentially heat exhaustion.  Patient with normal neurologic exam and soft and nontender abdomen. We'll give fluids, check basic labs and reassess.  9:07 PM Nonischemic ECG; no evidence of kidney failure, no leukocytosis.  10:30PM Pt feeling better will PO trial.   11:45 PM Pt continues to improve. He has not produced a urine sample.  Pt is well appearing and wishes to be d/c home.  I am no concerned about a rhabdo, ACS, acute abd, PNA at this time.  He has had no further emesis and is tolerating PO without emesis or pain.  He is afebrile and non tachycardic with normal vitals signs.  Repeat abd exam remains benign.    I have personally reviewed patient's vitals, nursing  note and any pertinent labs or imaging.  I performed an undressed physical exam.    At this time, it has been determined that no acute conditions requiring further emergency intervention. The patient/guardian have been advised of the diagnosis and plan. I reviewed all labs and imaging including any potential incidental findings. We have discussed signs and symptoms that warrant return to the ED, such as return of emesis, fatigue, weakness, or syncope.  Patient/guardian has voiced understanding and agreed to follow-up with the PCP or specialist in 3 days.  Vital signs are stable at discharge.   BP 153/70  Pulse 74  Temp(Src) 96.4 F (35.8 C) (Rectal)  Resp 18  Ht 5\' 10"  (1.778 m)  Wt 160 lb (72.576 kg)  BMI 22.96 kg/m2  SpO2 97%   The patient was discussed with and seen by Dr. Wilson Singer who agrees with the treatment plan.     Jarrett Soho Milina Pagett, PA-C 05/05/14 [place], PA-C 05/06/14 [id number]

## 2014-05-08 DIAGNOSIS — L0291 Cutaneous abscess, unspecified: Secondary | ICD-10-CM | POA: Diagnosis not present

## 2014-05-08 DIAGNOSIS — L0390 Cellulitis, unspecified: Secondary | ICD-10-CM | POA: Diagnosis not present

## 2014-05-08 NOTE — ED Provider Notes (Signed)
Medical screening examination/treatment/procedure(s) were conducted as a shared visit with non-physician practitioner(s) and myself.  I personally evaluated the patient during the encounter.   EKG Interpretation   Date/Time:  Monday May 05 2014 20:28:37 EDT Ventricular Rate:  78 PR Interval:  299 QRS Duration: 137 QT Interval:  429 QTC Calculation: 489 R Axis:   -53 Text Interpretation:  Sinus rhythm Prolonged PR interval Consider left  atrial enlargement RBBB and LAFB ST elevation, consider inferior injury ED  PHYSICIAN INTERPRETATION AVAILABLE IN [facility] Confirmed by TEST,  Record ([id number]) on 05/07/2014 7:37:12 AM     See other note.   Virgel Manifold, MD 05/08/14 [phone]

## 2014-06-18 DIAGNOSIS — F068 Other specified mental disorders due to known physiological condition: Secondary | ICD-10-CM | POA: Diagnosis not present

## 2014-07-02 DIAGNOSIS — L309 Dermatitis, unspecified: Secondary | ICD-10-CM | POA: Diagnosis not present

## 2014-07-02 DIAGNOSIS — Z6822 Body mass index (BMI) 22.0-22.9, adult: Secondary | ICD-10-CM | POA: Diagnosis not present

## 2014-07-10 DIAGNOSIS — L080 Pyoderma: Secondary | ICD-10-CM | POA: Diagnosis not present

## 2014-07-10 DIAGNOSIS — L309 Dermatitis, unspecified: Secondary | ICD-10-CM | POA: Diagnosis not present

## 2014-07-30 DIAGNOSIS — L309 Dermatitis, unspecified: Secondary | ICD-10-CM | POA: Diagnosis not present

## 2014-08-04 ENCOUNTER — Emergency Department (HOSPITAL_COMMUNITY): Payer: Medicare Other

## 2014-08-04 ENCOUNTER — Emergency Department (HOSPITAL_COMMUNITY)
Admission: EM | Admit: 2014-08-04 | Discharge: 2014-08-04 | Disposition: A | Discharge status: Other Acute Inpatient Hospital | Payer: Medicare Other | Attending: Emergency Medicine | Admitting: Emergency Medicine

## 2014-08-04 ENCOUNTER — Encounter (HOSPITAL_COMMUNITY): Payer: Self-pay | Admitting: Emergency Medicine

## 2014-08-04 DIAGNOSIS — Y93H9 Activity, other involving exterior property and land maintenance, building and construction: Secondary | ICD-10-CM | POA: Diagnosis not present

## 2014-08-04 DIAGNOSIS — Y999 Unspecified external cause status: Secondary | ICD-10-CM | POA: Insufficient documentation

## 2014-08-04 DIAGNOSIS — W208XXA Other cause of strike by thrown, projected or falling object, initial encounter: Secondary | ICD-10-CM | POA: Insufficient documentation

## 2014-08-04 DIAGNOSIS — S61002A Unspecified open wound of left thumb without damage to nail, initial encounter: Secondary | ICD-10-CM | POA: Diagnosis not present

## 2014-08-04 DIAGNOSIS — IMO0002 Reserved for concepts with insufficient information to code with codable children: Secondary | ICD-10-CM

## 2014-08-04 DIAGNOSIS — S61209A Unspecified open wound of unspecified finger without damage to nail, initial encounter: Secondary | ICD-10-CM | POA: Diagnosis not present

## 2014-08-04 DIAGNOSIS — S62609A Fracture of unspecified phalanx of unspecified finger, initial encounter for closed fracture: Secondary | ICD-10-CM | POA: Diagnosis not present

## 2014-08-04 DIAGNOSIS — S62522A Displaced fracture of distal phalanx of left thumb, initial encounter for closed fracture: Secondary | ICD-10-CM | POA: Diagnosis not present

## 2014-08-04 DIAGNOSIS — S62521A Displaced fracture of distal phalanx of right thumb, initial encounter for closed fracture: Secondary | ICD-10-CM | POA: Diagnosis not present

## 2014-08-04 DIAGNOSIS — S62502B Fracture of unspecified phalanx of left thumb, initial encounter for open fracture: Secondary | ICD-10-CM

## 2014-08-04 DIAGNOSIS — Y929 Unspecified place or not applicable: Secondary | ICD-10-CM | POA: Insufficient documentation

## 2014-08-04 MED ORDER — CEPHALEXIN 500 MG PO CAPS: 500.0000 mg | ORAL_CAPSULE | Freq: Four times a day (QID) | ORAL | Status: DC

## 2014-08-04 MED ORDER — HYDROCODONE-ACETAMINOPHEN 5-325 MG PO TABS: 1.0000 | ORAL_TABLET | ORAL | Status: DC | PRN

## 2014-08-04 MED ORDER — CEFAZOLIN SODIUM 1-5 GM-% IV SOLN
1.0000 g | Freq: Once | INTRAVENOUS | Status: AC
  Administered 2014-08-04: 1 g via INTRAVENOUS
  Filled 2014-08-04: qty 50

## 2014-08-04 MED ORDER — LIDOCAINE HCL 1 % IJ SOLN
20.0000 mL | Freq: Once | INTRAMUSCULAR | Status: DC
  Filled 2014-08-04 (×2): qty 20

## 2014-08-04 NOTE — ED Provider Notes (Signed)
Patient received in transfer from [facility] PA-C at [facility].  Briefly, 80 y.o. M with left thumb injury while working with table saw.  Patient with complex injures of left thumb and open fracture.  Patient transferred to [facility] via personal vehicle for evaluation by hand surgery, Dr. Burney Gauze.    On arrival, patient in NAD.  Thumb is bandaged appropriately, appears hemostatic.  Patient states his pain is well controlled.  Patient given IV Ancef, Dr. Burney Gauze paged and notified patient has arrived.  Results for orders placed or performed during the hospital encounter of 05/05/14  CBC with Differential  Result Value Ref Range   WBC 8.0 4.0 - 10.5 K/uL   RBC 4.50 4.22 - 5.81 MIL/uL   Hemoglobin 13.1 13.0 - 17.0 g/dL   HCT 39.2 39.0 - 52.0 %   MCV 87.1 78.0 - 100.0 fL   MCH 29.1 26.0 - 34.0 pg   MCHC 33.4 30.0 - 36.0 g/dL   RDW 15.4 11.5 - 15.5 %   Platelets 256 150 - 400 K/uL   Neutrophils Relative % 86 (H) 43 - 77 %   Neutro Abs 7.0 1.7 - 7.7 K/uL   Lymphocytes Relative 9 (L) 12 - 46 %   Lymphs Abs 0.7 0.7 - 4.0 K/uL   Monocytes Relative 4 3 - 12 %   Monocytes Absolute 0.3 0.1 - 1.0 K/uL   Eosinophils Relative 1 0 - 5 %   Eosinophils Absolute 0.1 0.0 - 0.7 K/uL   Basophils Relative 0 0 - 1 %   Basophils Absolute 0.0 0.0 - 0.1 K/uL  Comprehensive metabolic panel  Result Value Ref Range   Sodium 136 (L) 137 - 147 mEq/L   Potassium 4.2 3.7 - 5.3 mEq/L   Chloride 101 96 - 112 mEq/L   CO2 21 19 - 32 mEq/L   Glucose, Bld 124 (H) 70 - 99 mg/dL   BUN 34 (H) 6 - 23 mg/dL   Creatinine, Ser 1.24 0.50 - 1.35 mg/dL   Calcium 9.1 8.4 - 10.5 mg/dL   Total Protein 6.7 6.0 - 8.3 g/dL   Albumin 3.6 3.5 - 5.2 g/dL   AST 19 0 - 37 U/L   ALT 15 0 - 53 U/L   Alkaline Phosphatase 74 39 - 117 U/L   Total Bilirubin 0.3 0.3 - 1.2 mg/dL   GFR calc non Af Amer 50 (L) >90 mL/min   GFR calc Af Amer 58 (L) >90 mL/min   Anion gap 14 5 - 15  Troponin I  Result Value Ref Range   Troponin I <0.30 <0.30 ng/mL   CBG monitoring, ED  Result Value Ref Range   Glucose-Capillary 105 (H) 70 - 99 mg/dL   Dg Finger Thumb Left  08/04/2014   CLINICAL DATA:  Laceration of left thumb while cutting with this morning.  EXAM: LEFT THUMB 2+V  COMPARISON:  None.  FINDINGS: There is comminuted displaced fracture of the mid to distal first distal phalanx with associated soft tissue swelling and deformity. There is no dislocation.  IMPRESSION: Comminuted displaced fracture of the mid to distal first distal phalanx with associated soft tissue swelling and deformity.   Electronically Signed   By: Abelardo Diesel M.D.   On: 08/04/2014 15:47    Dr. Burney Gauze has evaluated patient-- does not feel patient's wounds need surgical intervention at this time.  Recommends sterile dressings, abx, and pain meds.  Will FU in office on Thursday morning.  Larene Pickett, PA-C 08/04/14 1918  Mariea Clonts, MD 08/05/14 [phone]

## 2014-08-04 NOTE — ED Notes (Signed)
Consult hand Doctor at bedside.

## 2014-08-04 NOTE — ED Notes (Signed)
Patient has a skin tear left forearm for few days. Placed gauze on skin tear and son at bedside states have been wearing long sleeves and did not know about skin tear.  Gauze stuck onto skin currently soaking with sterile saline and wound cleanser.  Patient left thumb soaking with betadine.

## 2014-08-04 NOTE — ED Notes (Signed)
Report given to Jenny, RN

## 2014-08-04 NOTE — ED Notes (Signed)
To radiology

## 2014-08-04 NOTE — Discharge Instructions (Signed)
Take the prescribed medication as directed. Follow-up with Dr. Burney Gauze on Thursday as instructed. Return to the ED for new or worsening symptoms.

## 2014-08-04 NOTE — ED Provider Notes (Signed)
CSN: [id number]     Arrival date & time 08/04/14  1307 History  This chart was scribed for non-physician practitioner, Clayton Bibles, PA-C working with Quintella Reichert, MD by Frederich Balding, ED scribe. This patient was seen in room WTR5/WTR5 and the patient's care was started at 1:30 PM.   Chief Complaint  Patient presents with  . Extremity Laceration   The history is provided by the patient. No language interpreter was used.    HPI Comments: William Cisneros is a 80 y.o. male who presents to the Emergency Department complaining of left thumb injury that occurred around 10:30 AM today. Pt was cutting wood with a table saw and states the wood kicked back and cut his finger multiple times. States he was evaluated at an urgent care and told he has multiple fractures in his thumb so he was sent here for evaluation. Reports very mild pain. Denies other injuries. Pt's last tetanus was in 2013. Pt is not currently on any blood thinners.    Past Medical History  Diagnosis Date  . Hypertension   . Hyperglycemia     pt states that he has  "hypo" glycemia  . COPD (chronic obstructive pulmonary disease)   . Hypoglycemia    Past Surgical History  Procedure Laterality Date  . Hemorrhoid surgery  1958  . Vein ligation and stripping      rt leg  . Colonoscopy     Family History  Problem Relation Age of Onset  . Colon cancer Neg Hx   . Esophageal cancer Neg Hx   . Stomach cancer Neg Hx    History  Substance Use Topics  . Smoking status: Former Smoker    Quit date: 09/26/1966  . Smokeless tobacco: Never Used  . Alcohol Use: Yes     Comment: occassional    Review of Systems  Musculoskeletal: Positive for arthralgias.  Skin: Positive for wound.  All other systems reviewed and are negative.  Allergies  Review of patient's allergies indicates no known allergies.  Home Medications   Prior to Admission medications   Medication Sig Start Date End Date Taking? Authorizing Provider  ibuprofen  (ADVIL,MOTRIN) 200 MG tablet Take 200 mg by mouth every 6 (six) hours as needed for mild pain.    Historical Provider, MD  metoprolol (TOPROL-XL) 50 MG 24 hr tablet Take 50 mg by mouth daily.     Historical Provider, MD  olmesartan (BENICAR) 40 MG tablet Take 1 tablet (40 mg total) by mouth daily. 08/16/12   Hoy Morn, MD   BP 136/69 mmHg  Pulse 74  Temp(Src) 97.7 F (36.5 C) (Oral)  Resp 14  Ht 5\' 10"  (1.778 m)  Wt 170 lb (77.111 kg)  BMI 24.39 kg/m2  SpO2 97%   Physical Exam  Constitutional: He appears well-developed and well-nourished. No distress.  HENT:  Head: Normocephalic and atraumatic.  Neck: Neck supple.  Pulmonary/Chest: Effort normal.  Musculoskeletal:       Left hand: He exhibits laceration. He exhibits normal range of motion, no tenderness, no bony tenderness and no swelling. Normal sensation noted. Normal strength noted.       Hands: Neurological: He is alert.  Skin: He is not diaphoretic.  Nursing note and vitals reviewed.   ED Course  Procedures (including critical care time)  DIAGNOSTIC STUDIES: Oxygen Saturation is 97% on RA, normal by my interpretation.    COORDINATION OF CARE: 1:32 PM-Discussed treatment plan which includes hand surgery consult with pt at bedside and  pt agreed to plan.   Labs Review Labs Reviewed - No data to display  Imaging Review Dg Finger Thumb Left  08/04/2014   CLINICAL DATA:  Laceration of left thumb while cutting with this morning.  EXAM: LEFT THUMB 2+V  COMPARISON:  None.  FINDINGS: There is comminuted displaced fracture of the mid to distal first distal phalanx with associated soft tissue swelling and deformity. There is no dislocation.  IMPRESSION: Comminuted displaced fracture of the mid to distal first distal phalanx with associated soft tissue swelling and deformity.   Electronically Signed   By: Abelardo Diesel M.D.   On: 08/04/2014 15:47     EKG Interpretation None       1:38 PM Discussed pt with Dr Ralene Bathe, who  will also see the patient.   3:41 PM-Consulted with Dr. Burney Gauze. Will transfer pt to [facility] ED and Dr. Burney Gauze will evaluate him there.  Pt to be transported by private vehicle (his son will transport).  I spoke with Quincy Carnes, PA-C, who is aware of the patient and will see pt and order necessary supplies and abx on arrival.    MDM   Final diagnoses:  Laceration  Open fracture of left thumb, initial encounter    Afebrile, nontoxic patient with open fracture to left thumb.  Neurovascularly intact.  Discussed with Jeani Hawking, proxy for Dr Burney Gauze.  Dr Burney Gauze requests transfer to [facility] ED so that he can evaluate him.     I personally performed the services described in this documentation, which was scribed in my presence. The recorded information has been reviewed and is accurate.  Clayton Bibles, PA-C 08/04/14 [place], MD 08/04/14 [phone]

## 2014-08-04 NOTE — ED Notes (Signed)
Pt left thumb soaked in betadine and wrapped.  Pt given instructions to care for bandage.

## 2014-08-04 NOTE — ED Notes (Signed)
Pt A+Ox4, reports was cutting wood with table saw and cut L thumb around 1030 this AM.  Pt reports was seen at urgent care "and they told my multiple lacerations and fractures".  No active bleeding at this time.  Skin otherwise PWD.  Pt denies pain.  +csm/+pulses.

## 2014-08-04 NOTE — Consult Note (Signed)
Reason for Consult:left thumb laceration Referring Physician: [name] is an 80 y.o. male.  HPI: s/p tablesaw injury with complex open wound and xr popsitive for distal phalanx fracture  Past Medical History  Diagnosis Date  . Hypertension   . Hyperglycemia     pt states that he has  "hypo" glycemia  . COPD (chronic obstructive pulmonary disease)   . Hypoglycemia     Past Surgical History  Procedure Laterality Date  . Hemorrhoid surgery  1958  . Vein ligation and stripping      rt leg  . Colonoscopy      Family History  Problem Relation Age of Onset  . Colon cancer Neg Hx   . Esophageal cancer Neg Hx   . Stomach cancer Neg Hx     Social History:  reports that he quit smoking about 47 years ago. He has never used smokeless tobacco. He reports that he drinks alcohol. He reports that he does not use illicit drugs.  Allergies: No Known Allergies  Medications:  Scheduled: . lidocaine  20 mL Other Once    No results found for this or any previous visit (from the past 48 hour(s)).  Dg Finger Thumb Left  08/04/2014   CLINICAL DATA:  Laceration of left thumb while cutting with this morning.  EXAM: LEFT THUMB 2+V  COMPARISON:  None.  FINDINGS: There is comminuted displaced fracture of the mid to distal first distal phalanx with associated soft tissue swelling and deformity. There is no dislocation.  IMPRESSION: Comminuted displaced fracture of the mid to distal first distal phalanx with associated soft tissue swelling and deformity.   Electronically Signed   By: Abelardo Diesel M.D.   On: 08/04/2014 15:47    Review of Systems  All other systems reviewed and are negative.  Blood pressure 156/80, pulse 70, temperature 98.2 F (36.8 C), temperature source Oral, resp. rate 14, height 5\' 10"  (1.778 m), weight 77.111 kg (170 lb), SpO2 99 %. Physical Exam  Constitutional: He is oriented to person, place, and time. He appears well-developed and well-nourished.  HENT:   Head: Normocephalic and atraumatic.  Cardiovascular: Normal rate.   Respiratory: Effort normal.  Musculoskeletal:       Left hand: He exhibits bony tenderness and laceration.  Left thumb complex distal wound with no exposed tendon or bone  Neurological: He is alert and oriented to person, place, and time.  Skin: Skin is warm.  Psychiatric: He has a normal mood and affect. His behavior is normal. Judgment and thought content normal.    Assessment/Plan: As above  Patient has no exposed tendon or bone and no gross angulation of digit with complex open distal wound   Would recommend betadine soak and sterile dressing application with followup in my office this Thursday morning  Pain meds and ABX as per ER staff  [name] 08/04/2014, 5:30 PM

## 2014-08-04 NOTE — ED Notes (Signed)
Pt made aware to return if symptoms worsen or if any life threatening symptoms occur.   

## 2014-08-07 DIAGNOSIS — S62522D Displaced fracture of distal phalanx of left thumb, subsequent encounter for fracture with routine healing: Secondary | ICD-10-CM | POA: Diagnosis not present

## 2014-08-07 DIAGNOSIS — S62522A Displaced fracture of distal phalanx of left thumb, initial encounter for closed fracture: Secondary | ICD-10-CM | POA: Diagnosis not present

## 2014-08-07 DIAGNOSIS — S61012D Laceration without foreign body of left thumb without damage to nail, subsequent encounter: Secondary | ICD-10-CM | POA: Diagnosis not present

## 2014-08-18 DIAGNOSIS — S62522D Displaced fracture of distal phalanx of left thumb, subsequent encounter for fracture with routine healing: Secondary | ICD-10-CM | POA: Diagnosis not present

## 2014-08-18 DIAGNOSIS — S61012D Laceration without foreign body of left thumb without damage to nail, subsequent encounter: Secondary | ICD-10-CM | POA: Diagnosis not present

## 2014-09-09 DIAGNOSIS — Z125 Encounter for screening for malignant neoplasm of prostate: Secondary | ICD-10-CM | POA: Diagnosis not present

## 2014-09-09 DIAGNOSIS — Z79899 Other long term (current) drug therapy: Secondary | ICD-10-CM | POA: Diagnosis not present

## 2014-09-09 DIAGNOSIS — E785 Hyperlipidemia, unspecified: Secondary | ICD-10-CM | POA: Diagnosis not present

## 2014-09-09 DIAGNOSIS — I10 Essential (primary) hypertension: Secondary | ICD-10-CM | POA: Diagnosis not present

## 2014-09-16 DIAGNOSIS — Z008 Encounter for other general examination: Secondary | ICD-10-CM | POA: Diagnosis not present

## 2014-09-16 DIAGNOSIS — I129 Hypertensive chronic kidney disease with stage 1 through stage 4 chronic kidney disease, or unspecified chronic kidney disease: Secondary | ICD-10-CM | POA: Diagnosis not present

## 2014-09-16 DIAGNOSIS — Z1389 Encounter for screening for other disorder: Secondary | ICD-10-CM | POA: Diagnosis not present

## 2014-09-16 DIAGNOSIS — Z23 Encounter for immunization: Secondary | ICD-10-CM | POA: Diagnosis not present

## 2014-09-16 DIAGNOSIS — K222 Esophageal obstruction: Secondary | ICD-10-CM | POA: Diagnosis not present

## 2014-09-16 DIAGNOSIS — K219 Gastro-esophageal reflux disease without esophagitis: Secondary | ICD-10-CM | POA: Diagnosis not present

## 2014-09-16 DIAGNOSIS — I10 Essential (primary) hypertension: Secondary | ICD-10-CM | POA: Diagnosis not present

## 2014-09-16 DIAGNOSIS — N183 Chronic kidney disease, stage 3 (moderate): Secondary | ICD-10-CM | POA: Diagnosis not present

## 2014-09-23 DIAGNOSIS — S62522D Displaced fracture of distal phalanx of left thumb, subsequent encounter for fracture with routine healing: Secondary | ICD-10-CM | POA: Diagnosis not present

## 2014-09-23 DIAGNOSIS — S61012D Laceration without foreign body of left thumb without damage to nail, subsequent encounter: Secondary | ICD-10-CM | POA: Diagnosis not present

## 2014-09-30 DIAGNOSIS — Z961 Presence of intraocular lens: Secondary | ICD-10-CM | POA: Diagnosis not present

## 2014-09-30 DIAGNOSIS — H3122 Choroidal dystrophy (central areolar) (generalized) (peripapillary): Secondary | ICD-10-CM | POA: Diagnosis not present

## 2014-10-21 DIAGNOSIS — L249 Irritant contact dermatitis, unspecified cause: Secondary | ICD-10-CM | POA: Diagnosis not present

## 2014-10-21 DIAGNOSIS — L304 Erythema intertrigo: Secondary | ICD-10-CM | POA: Diagnosis not present

## 2014-11-06 DIAGNOSIS — S0003XA Contusion of scalp, initial encounter: Secondary | ICD-10-CM | POA: Diagnosis not present

## 2014-11-06 DIAGNOSIS — L239 Allergic contact dermatitis, unspecified cause: Secondary | ICD-10-CM | POA: Diagnosis not present

## 2014-11-06 DIAGNOSIS — B356 Tinea cruris: Secondary | ICD-10-CM | POA: Diagnosis not present

## 2014-11-06 DIAGNOSIS — Z6822 Body mass index (BMI) 22.0-22.9, adult: Secondary | ICD-10-CM | POA: Diagnosis not present

## 2014-11-10 DIAGNOSIS — Z6823 Body mass index (BMI) 23.0-23.9, adult: Secondary | ICD-10-CM | POA: Diagnosis not present

## 2014-11-10 DIAGNOSIS — L239 Allergic contact dermatitis, unspecified cause: Secondary | ICD-10-CM | POA: Diagnosis not present

## 2015-01-30 ENCOUNTER — Other Ambulatory Visit (HOSPITAL_COMMUNITY): Payer: Self-pay | Admitting: Physician Assistant

## 2015-01-30 DIAGNOSIS — L570 Actinic keratosis: Secondary | ICD-10-CM | POA: Diagnosis not present

## 2015-01-30 DIAGNOSIS — L820 Inflamed seborrheic keratosis: Secondary | ICD-10-CM | POA: Diagnosis not present

## 2015-07-01 DIAGNOSIS — Z23 Encounter for immunization: Secondary | ICD-10-CM | POA: Diagnosis not present

## 2015-07-01 DIAGNOSIS — I10 Essential (primary) hypertension: Secondary | ICD-10-CM | POA: Diagnosis not present

## 2015-07-01 DIAGNOSIS — F0390 Unspecified dementia without behavioral disturbance: Secondary | ICD-10-CM | POA: Diagnosis not present

## 2015-07-01 DIAGNOSIS — Z6821 Body mass index (BMI) 21.0-21.9, adult: Secondary | ICD-10-CM | POA: Diagnosis not present

## 2015-07-01 DIAGNOSIS — B0229 Other postherpetic nervous system involvement: Secondary | ICD-10-CM | POA: Diagnosis not present

## 2015-09-28 ENCOUNTER — Emergency Department (HOSPITAL_COMMUNITY): Payer: Medicare Other

## 2015-09-28 ENCOUNTER — Encounter (HOSPITAL_COMMUNITY): Payer: Self-pay | Admitting: *Deleted

## 2015-09-28 ENCOUNTER — Inpatient Hospital Stay (HOSPITAL_COMMUNITY)
Admission: EM | Admit: 2015-09-28 | Discharge: 2015-09-30 | DRG: 308 | Disposition: A | Discharge status: Home / Self Care | Payer: Medicare Other | Attending: Internal Medicine | Admitting: Internal Medicine

## 2015-09-28 DIAGNOSIS — R627 Adult failure to thrive: Secondary | ICD-10-CM | POA: Diagnosis not present

## 2015-09-28 DIAGNOSIS — R0989 Other specified symptoms and signs involving the circulatory and respiratory systems: Secondary | ICD-10-CM | POA: Diagnosis not present

## 2015-09-28 DIAGNOSIS — I779 Disorder of arteries and arterioles, unspecified: Secondary | ICD-10-CM | POA: Diagnosis not present

## 2015-09-28 DIAGNOSIS — F0390 Unspecified dementia without behavioral disturbance: Secondary | ICD-10-CM | POA: Diagnosis not present

## 2015-09-28 DIAGNOSIS — E43 Unspecified severe protein-calorie malnutrition: Secondary | ICD-10-CM | POA: Diagnosis not present

## 2015-09-28 DIAGNOSIS — R042 Hemoptysis: Secondary | ICD-10-CM

## 2015-09-28 DIAGNOSIS — R05 Cough: Secondary | ICD-10-CM | POA: Diagnosis not present

## 2015-09-28 DIAGNOSIS — R531 Weakness: Secondary | ICD-10-CM

## 2015-09-28 DIAGNOSIS — I10 Essential (primary) hypertension: Secondary | ICD-10-CM | POA: Diagnosis not present

## 2015-09-28 DIAGNOSIS — R059 Cough, unspecified: Secondary | ICD-10-CM

## 2015-09-28 DIAGNOSIS — R110 Nausea: Secondary | ICD-10-CM | POA: Diagnosis not present

## 2015-09-28 DIAGNOSIS — E860 Dehydration: Secondary | ICD-10-CM | POA: Diagnosis present

## 2015-09-28 DIAGNOSIS — Z87891 Personal history of nicotine dependence: Secondary | ICD-10-CM | POA: Diagnosis not present

## 2015-09-28 DIAGNOSIS — I480 Paroxysmal atrial fibrillation: Secondary | ICD-10-CM | POA: Diagnosis present

## 2015-09-28 DIAGNOSIS — E861 Hypovolemia: Secondary | ICD-10-CM | POA: Diagnosis present

## 2015-09-28 DIAGNOSIS — Z6823 Body mass index (BMI) 23.0-23.9, adult: Secondary | ICD-10-CM

## 2015-09-28 DIAGNOSIS — W19XXXA Unspecified fall, initial encounter: Secondary | ICD-10-CM | POA: Diagnosis present

## 2015-09-28 DIAGNOSIS — I4891 Unspecified atrial fibrillation: Secondary | ICD-10-CM | POA: Diagnosis present

## 2015-09-28 MED ORDER — SODIUM CHLORIDE 0.9 % IV BOLUS (SEPSIS)
500.0000 mL | Freq: Once | INTRAVENOUS | Status: AC
  Administered 2015-09-29: 500 mL via INTRAVENOUS

## 2015-09-28 NOTE — ED Provider Notes (Signed)
CSN: [id number]     Arrival date & time 09/28/15  2224 History  By signing my name below, I, Evelene Croon, attest that this documentation has been prepared under the direction and in the presence of Julianne Rice, MD . Electronically Signed: Evelene Croon, Scribe. 09/29/2015. 12:19 AM.   Chief Complaint  Patient presents with  . Weakness    LEVEL 5 CAVEAT DUE TO Dementia   The history is provided by the patient and a relative (son). No language interpreter was used.    HPI Comments:  William Cisneros is a 82 y.o. male with a history of dementia, HTN, and COPD, who presents to the Emergency Department via EMS with son for generalized weakness.  Son states he last saw the pt 2 days ago and he was normal. Pt lives alone and is very independent per son. Son also reports h/o dehydration; also notes pt is not taking any medications currently.  Pt denies pain at this time; no CP, neck pain, back pain or abdominal pain. He states he had been eating and drinking as he normally would until today. No alleviating factors noted.   Past Medical History  Diagnosis Date  . Hypertension   . Hyperglycemia     pt states that he has  "hypo" glycemia  . COPD (chronic obstructive pulmonary disease) ([place])   . Hypoglycemia    Past Surgical History  Procedure Laterality Date  . Hemorrhoid surgery  1958  . Vein ligation and stripping      rt leg  . Colonoscopy     Family History  Problem Relation Age of Onset  . Colon cancer Neg Hx   . Esophageal cancer Neg Hx   . Stomach cancer Neg Hx    Social History  Substance Use Topics  . Smoking status: Former Smoker    Quit date: 09/26/1966  . Smokeless tobacco: Never Used  . Alcohol Use: Yes     Comment: occassional    Review of Systems  Constitutional: Positive for activity change, appetite change and fatigue. Negative for fever and chills.  Respiratory: Positive for cough. Negative for shortness of breath.   Cardiovascular: Negative for chest pain  and leg swelling.  Gastrointestinal: Negative for nausea, vomiting, abdominal pain, diarrhea and constipation.  Genitourinary: Negative for dysuria and flank pain.  Musculoskeletal: Negative for back pain and neck pain.  Skin: Negative for rash and wound.  Neurological: Positive for weakness (generalized). Negative for dizziness, numbness and headaches.  Psychiatric/Behavioral: Positive for confusion.  All other systems reviewed and are negative.    Allergies  Review of patient's allergies indicates no known allergies.  Home Medications   Prior to Admission medications   Medication Sig Start Date End Date Taking? Authorizing Provider  cephALEXin (KEFLEX) 500 MG capsule Take 1 capsule (500 mg total) by mouth 4 (four) times daily. Patient not taking: Reported on 09/28/2015 08/04/14   Larene Pickett, PA-C  HYDROcodone-acetaminophen (NORCO/VICODIN) 5-325 MG per tablet Take 1 tablet by mouth every 4 (four) hours as needed. Patient not taking: Reported on 09/28/2015 08/04/14   Larene Pickett, PA-C  olmesartan (BENICAR) 40 MG tablet Take 1 tablet (40 mg total) by mouth daily. Patient not taking: Reported on 09/28/2015 08/16/12   Jola Schmidt, MD   BP 138/71 mmHg  Pulse 94  Resp 19  Ht 6' (1.829 m)  Wt 170 lb (77.111 kg)  BMI 23.05 kg/m2  SpO2 95% Physical Exam  Constitutional: He appears well-developed and well-nourished. No distress.  Dry and cachectic  HENT:  Head: Normocephalic and atraumatic.  Dry mucous membranes. No evidence of any head or neck trauma.  Eyes: EOM are normal. Pupils are equal, round, and reactive to light.  Neck: Normal range of motion. Neck supple.  No posterior midline cervical tenderness to palpation. No meningismus  Cardiovascular: Exam reveals no gallop and no friction rub.   No murmur heard. Irregularly irregular  Pulmonary/Chest: Effort normal. No respiratory distress. He has no wheezes. He has rales.  Scattered Rales  Abdominal: Soft. Bowel sounds are  normal. He exhibits no distension and no mass. There is no tenderness. There is no rebound and no guarding.  Musculoskeletal: Normal range of motion. He exhibits no edema or tenderness.  No midline thoracic or lumbar tenderness. No lower extremity swelling or pain.   Neurological:  Oriented to person and place. Moving all extremities without focal deficit. Sensation appears to be intact.  Skin: Skin is warm and dry. No rash noted. No erythema.  Psychiatric: He has a normal mood and affect. His behavior is normal.  Nursing note and vitals reviewed.   ED Course  Procedures   DIAGNOSTIC STUDIES:  Oxygen Saturation is 95% on RA, normal by my interpretation.    COORDINATION OF CARE:  11:44 PM Discussed treatment plan with pt and son at bedside and they agreed to plan.  Labs Review Labs Reviewed  CBC WITH DIFFERENTIAL/PLATELET - Abnormal; Notable for the following:    WBC 11.1 (*)    Neutro Abs 10.2 (*)    Lymphs Abs 0.5 (*)    All other components within normal limits  COMPREHENSIVE METABOLIC PANEL - Abnormal; Notable for the following:    Glucose, Bld 134 (*)    BUN 35 (*)    Total Protein 6.3 (*)    Albumin 3.3 (*)    GFR calc non Af Amer 57 (*)    All other components within normal limits  URINALYSIS, ROUTINE W REFLEX MICROSCOPIC (NOT AT [facility]) - Abnormal; Notable for the following:    Ketones, ur 15 (*)    All other components within normal limits  URINE RAPID DRUG SCREEN, HOSP PERFORMED  ETHANOL  I-STAT TROPOININ, ED  I-STAT CG4 LACTIC ACID, ED    Imaging Review Ct Head Wo Contrast  09/29/2015  CLINICAL DATA:  Generalized weakness. EXAM: CT HEAD WITHOUT CONTRAST TECHNIQUE: Contiguous axial images were obtained from the base of the skull through the vertex without intravenous contrast. COMPARISON:  Head CT and brain MRI May 2007 FINDINGS: Generalized atrophy and chronic small vessel ischemia, stable from prior exam. No intracranial hemorrhage, mass effect, or midline shift.  No hydrocephalus. The basilar cisterns are patent. No evidence of territorial infarct. No intracranial fluid collection. Calvarium is intact. Included paranasal sinuses and mastoid air cells are well aerated. IMPRESSION: Stable atrophy and chronic small vessel ischemic change. No acute intracranial abnormality. Electronically Signed   By: Jeb Levering M.D.   On: 09/29/2015 03:03   Dg Chest Port 1 View  09/29/2015  CLINICAL DATA:  Generalized weakness and cough. EXAM: PORTABLE CHEST 1 VIEW COMPARISON:  04/26/2011 FINDINGS: Lower lung volumes from prior exam. Bilateral calcified pleural plaques are again seen. Heart at the upper limits of normal in size. Atherosclerosis of the thoracic aorta. Ill-defined bibasilar opacities, favor atelectasis. No pulmonary edema, large pleural effusion or pneumothorax. IMPRESSION: Low lung volumes with bibasilar opacities, favor atelectasis. Bilateral calcified pleural plaques, grossly unchanged. Electronically Signed   By: Fonnie Birkenhead.D.  On: 09/29/2015 00:51   I have personally reviewed and evaluated these images and lab results as part of my medical decision-making.   EKG Interpretation   Date/Time:  Tuesday September 29 2015 00:05:17 EST Ventricular Rate:  85 PR Interval:    QRS Duration: 136 QT Interval:  414 QTC Calculation: 492 R Axis:   -55 Text Interpretation:  Atrial fibrillation RBBB and LAFB Confirmed by  Kiana Hollar  MD, Deneise Getty ([id number]) on 09/29/2015 3:40:24 AM      MDM   Final diagnoses:  Failure to thrive in adult  Atrial fibrillation, unspecified type ([place])  Dehydration      I personally performed the services described in this documentation, which was scribed in my presence. The recorded information has been reviewed and is accurate.   Patient has new onset atrial fibrillation. Also evidence of dehydration and failure to thrive. Will admit to internal medicine.  Julianne Rice, MD 09/29/15 0400

## 2015-09-28 NOTE — ED Notes (Signed)
Pt. Called EMS for shakiness and weakness. Pt. Was also nauseated. Denies pain. CBG on EMS arrival 143

## 2015-09-29 ENCOUNTER — Inpatient Hospital Stay (HOSPITAL_COMMUNITY): Payer: Medicare Other

## 2015-09-29 ENCOUNTER — Encounter (HOSPITAL_COMMUNITY): Payer: Self-pay | Admitting: Family Medicine

## 2015-09-29 ENCOUNTER — Emergency Department (HOSPITAL_COMMUNITY): Payer: Medicare Other

## 2015-09-29 DIAGNOSIS — E860 Dehydration: Secondary | ICD-10-CM | POA: Diagnosis not present

## 2015-09-29 DIAGNOSIS — W19XXXA Unspecified fall, initial encounter: Secondary | ICD-10-CM | POA: Diagnosis present

## 2015-09-29 DIAGNOSIS — Z87891 Personal history of nicotine dependence: Secondary | ICD-10-CM | POA: Diagnosis not present

## 2015-09-29 DIAGNOSIS — I779 Disorder of arteries and arterioles, unspecified: Secondary | ICD-10-CM

## 2015-09-29 DIAGNOSIS — R627 Adult failure to thrive: Secondary | ICD-10-CM | POA: Diagnosis not present

## 2015-09-29 DIAGNOSIS — R0989 Other specified symptoms and signs involving the circulatory and respiratory systems: Secondary | ICD-10-CM | POA: Diagnosis not present

## 2015-09-29 DIAGNOSIS — I480 Paroxysmal atrial fibrillation: Principal | ICD-10-CM

## 2015-09-29 DIAGNOSIS — R531 Weakness: Secondary | ICD-10-CM

## 2015-09-29 DIAGNOSIS — I4891 Unspecified atrial fibrillation: Secondary | ICD-10-CM | POA: Diagnosis present

## 2015-09-29 DIAGNOSIS — E43 Unspecified severe protein-calorie malnutrition: Secondary | ICD-10-CM | POA: Insufficient documentation

## 2015-09-29 DIAGNOSIS — E861 Hypovolemia: Secondary | ICD-10-CM | POA: Diagnosis present

## 2015-09-29 DIAGNOSIS — F0390 Unspecified dementia without behavioral disturbance: Secondary | ICD-10-CM | POA: Diagnosis not present

## 2015-09-29 DIAGNOSIS — R05 Cough: Secondary | ICD-10-CM | POA: Diagnosis not present

## 2015-09-29 DIAGNOSIS — I10 Essential (primary) hypertension: Secondary | ICD-10-CM | POA: Diagnosis not present

## 2015-09-29 DIAGNOSIS — Z6823 Body mass index (BMI) 23.0-23.9, adult: Secondary | ICD-10-CM | POA: Diagnosis not present

## 2015-09-29 LAB — URINALYSIS, ROUTINE W REFLEX MICROSCOPIC
BILIRUBIN URINE: NEGATIVE
Glucose, UA: NEGATIVE mg/dL
HGB URINE DIPSTICK: NEGATIVE
KETONES UR: 15 mg/dL — AB
Leukocytes, UA: NEGATIVE
NITRITE: NEGATIVE
PROTEIN: NEGATIVE mg/dL
SPECIFIC GRAVITY, URINE: 1.02 (ref 1.005–1.030)
pH: 6 (ref 5.0–8.0)

## 2015-09-29 LAB — CBC WITH DIFFERENTIAL/PLATELET
BASOS ABS: 0 10*3/uL (ref 0.0–0.1)
BASOS PCT: 0 %
EOS PCT: 0 %
Eosinophils Absolute: 0 10*3/uL (ref 0.0–0.7)
HCT: 42.2 % (ref 39.0–52.0)
Hemoglobin: 14.2 g/dL (ref 13.0–17.0)
Lymphocytes Relative: 4 %
Lymphs Abs: 0.5 10*3/uL — ABNORMAL LOW (ref 0.7–4.0)
MCH: 28.9 pg (ref 26.0–34.0)
MCHC: 33.6 g/dL (ref 30.0–36.0)
MCV: 85.8 fL (ref 78.0–100.0)
MONO ABS: 0.4 10*3/uL (ref 0.1–1.0)
MONOS PCT: 4 %
Neutro Abs: 10.2 10*3/uL — ABNORMAL HIGH (ref 1.7–7.7)
Neutrophils Relative %: 92 %
PLATELETS: 201 10*3/uL (ref 150–400)
RBC: 4.92 MIL/uL (ref 4.22–5.81)
RDW: 15.3 % (ref 11.5–15.5)
WBC: 11.1 10*3/uL — ABNORMAL HIGH (ref 4.0–10.5)

## 2015-09-29 LAB — COMPREHENSIVE METABOLIC PANEL
ALBUMIN: 3.3 g/dL — AB (ref 3.5–5.0)
ALT: 18 U/L (ref 17–63)
ANION GAP: 10 (ref 5–15)
AST: 23 U/L (ref 15–41)
Alkaline Phosphatase: 71 U/L (ref 38–126)
BILIRUBIN TOTAL: 0.7 mg/dL (ref 0.3–1.2)
BUN: 35 mg/dL — ABNORMAL HIGH (ref 6–20)
CHLORIDE: 107 mmol/L (ref 101–111)
CO2: 23 mmol/L (ref 22–32)
Calcium: 9.1 mg/dL (ref 8.9–10.3)
Creatinine, Ser: 1.11 mg/dL (ref 0.61–1.24)
GFR calc Af Amer: >60 mL/min (ref >60)
GFR, EST NON AFRICAN AMERICAN: 57 mL/min — AB (ref >60)
GLUCOSE: 134 mg/dL — AB (ref 65–99)
POTASSIUM: 4.4 mmol/L (ref 3.5–5.1)
Sodium: 140 mmol/L (ref 135–145)
TOTAL PROTEIN: 6.3 g/dL — AB (ref 6.5–8.1)

## 2015-09-29 LAB — RAPID URINE DRUG SCREEN, HOSP PERFORMED
Amphetamines: NOT DETECTED
Barbiturates: NOT DETECTED
Benzodiazepines: NOT DETECTED
COCAINE: NOT DETECTED
OPIATES: NOT DETECTED
Tetrahydrocannabinol: NOT DETECTED

## 2015-09-29 LAB — TSH: TSH: 2.241 u[IU]/mL (ref 0.350–4.500)

## 2015-09-29 LAB — ETHANOL: Alcohol, Ethyl (B): <5 mg/dL (ref <5)

## 2015-09-29 LAB — PROTIME-INR
INR: 1.22 (ref 0.00–1.49)
PROTHROMBIN TIME: 15.6 s — AB (ref 11.6–15.2)

## 2015-09-29 LAB — I-STAT TROPONIN, ED: TROPONIN I, POC: 0 ng/mL (ref 0.00–0.08)

## 2015-09-29 LAB — MAGNESIUM: Magnesium: 1.8 mg/dL (ref 1.7–2.4)

## 2015-09-29 LAB — I-STAT CG4 LACTIC ACID, ED: LACTIC ACID, VENOUS: 1 mmol/L (ref 0.5–2.0)

## 2015-09-29 MED ORDER — WARFARIN SODIUM 5 MG PO TABS: 5.0000 mg | ORAL_TABLET | Freq: Once | ORAL | Status: DC

## 2015-09-29 MED ORDER — ENSURE ENLIVE PO LIQD
237.0000 mL | Freq: Two times a day (BID) | ORAL | Status: DC
  Administered 2015-09-29 – 2015-09-30 (×3): 237 mL via ORAL

## 2015-09-29 MED ORDER — WARFARIN - PHARMACIST DOSING INPATIENT: Freq: Every day | Status: DC

## 2015-09-29 MED ORDER — APIXABAN 5 MG PO TABS
5.0000 mg | ORAL_TABLET | Freq: Two times a day (BID) | ORAL | Status: DC
  Administered 2015-09-29 – 2015-09-30 (×2): 5 mg via ORAL
  Filled 2015-09-29 (×2): qty 1

## 2015-09-29 MED ORDER — SODIUM CHLORIDE 0.9 % IV SOLN: INTRAVENOUS | Status: DC

## 2015-09-29 MED ORDER — SODIUM CHLORIDE 0.9 % IV BOLUS (SEPSIS)
500.0000 mL | Freq: Once | INTRAVENOUS | Status: AC
Start: 2015-09-29 — End: 2015-09-29
  Administered 2015-09-29: 500 mL via INTRAVENOUS

## 2015-09-29 MED ORDER — SODIUM CHLORIDE 0.9 % IV SOLN
INTRAVENOUS | Status: DC
  Administered 2015-09-29 (×2): via INTRAVENOUS

## 2015-09-29 NOTE — Progress Notes (Addendum)
VASCULAR LAB PRELIMINARY  PRELIMINARY  PRELIMINARY  PRELIMINARY   Carotid Duplex Completed.  Right ICA abnormal waveforms are consistent with a 60-79% stenosis based Peak Systolic Velocities. Left ICA abnormal waveforms are consistent with a 40-59% stenosis based Peak Systolioc Velocities. Bilaterally vertebral patent and antegrade flow. Janifer Adie, RVT, RDMS 09/29/2015, 4:47 PM

## 2015-09-29 NOTE — Progress Notes (Signed)
ANTICOAGULATION CONSULT NOTE - Initial Consult  Pharmacy Consult for Warfarin Indication: atrial fibrillation  No Known Allergies  Patient Measurements: Height: 6' (182.9 cm) Weight: 170 lb (77.111 kg) IBW/kg (Calculated) : 77.6  Vital Signs: BP: 143/73 mmHg (01/03 0430) Pulse Rate: 95 (01/03 0430)  Labs:  Recent Labs  09/29/15 0003  HGB 14.2  HCT 42.2  PLT 201  CREATININE 1.11    Estimated Creatinine Clearance: 49.2 mL/min (by C-G formula based on Cr of 1.11).   Medical History: Past Medical History  Diagnosis Date  . Hypertension   . Hyperglycemia     pt states that he has  "hypo" glycemia  . COPD (chronic obstructive pulmonary disease) ([place])   . Hypoglycemia     Medications:  Prescriptions prior to admission  Medication Sig Dispense Refill Last Dose  . olmesartan (BENICAR) 40 MG tablet Take 1 tablet (40 mg total) by mouth daily. (Patient not taking: Reported on 09/28/2015) 30 tablet 0 Not Taking at Unknown time    Assessment: 89 YOM who presented to the ED via EMS for generalized weakness. Found to be in Afib. Pharmacy consulted to start warfarin. Patient was not on any anticoagulation prior to admission. H/H and Plt wnl. BL INR 1.02  Goal of Therapy:  INR 2-3 Monitor platelets by anticoagulation protocol: Yes   Plan:  -Coumadin 5 mg x 1 dose today -Monitor daily PT/INR   Albertina Parr, PharmD., BCPS Clinical Pharmacist Pager [phone]

## 2015-09-29 NOTE — Progress Notes (Signed)
ANTICOAGULATION CONSULT NOTE - Follow Up Consult  Pharmacy Consult for Eliquis Indication: atrial fibrillation  No Known Allergies  Patient Measurements: Height: 6' (182.9 cm) Weight: 171 lb 9.6 oz (77.837 kg) IBW/kg (Calculated) : 77.6  Labs:  Recent Labs  09/29/15 0003 09/29/15 1428  HGB 14.2  --   HCT 42.2  --   PLT 201  --   LABPROT  --  15.6*  INR  --  1.22  CREATININE 1.11  --     Estimated Creatinine Clearance: 49.5 mL/min (by C-G formula based on Cr of 1.11).  Assessment:   Coumadin ordered for afib early this morning, but first dose had been scheduled for 6 pm today, so no Coumadin given yet.  Now to change to Eliquis for afib.   82 yrs old, but > 60 kg and Scr < 1.5 so qualifies for full-dosing.  Goal of Therapy:  appropriate Eliquis dose for indication Monitor platelets by anticoagulation protocol: Yes   Plan:    Eliquis 5 mg BID to begin today.   Intermittent CBC.  Arty Baumgartner, [place] Pager: [phone] 09/29/2015,4:01 PM

## 2015-09-29 NOTE — Care Management (Signed)
Pt copay for Eliquis 5 BID  Is $24 for 30 days.  CRoyal RN MPH, case manager, [phone]

## 2015-09-29 NOTE — Consult Note (Addendum)
CARDIOLOGY CONSULT NOTE   Patient ID: William Cisneros MRN: [id number], DOB/AGE: 80/28/1925   Admit date: 09/28/2015 Date of Consult: 09/29/2015   Primary Physician: Velna Hatchet, MD Primary Cardiologist: new   Pt. Profile  William Cisneros is a pleasant 82 yo male with PMH of HTN, mild dementia and esophageal stricture who presented to [facility] on 09/28/2015 with dehydration and found to have new afib (self rate controlled without AV nodal blocking agent)  Problem List  Past Medical History  Diagnosis Date  . Hypertension   . Hyperglycemia     pt states that he has  "hypo" glycemia  . COPD (chronic obstructive pulmonary disease) ([place])   . Hypoglycemia     Past Surgical History  Procedure Laterality Date  . Hemorrhoid surgery  1958  . Vein ligation and stripping      rt leg  . Colonoscopy       Allergies  No Known Allergies  HPI   William Cisneros is a pleasant 82 yo male with PMH of HTN, mild dementia and esophageal stricture. He has no past cardiac history, never had chest pain or shortness of breath before. Last echocardiogram obtained on 09/21/2010 showed EF 70%, no regional wall motion abnormality, mild MR. He never had a stroke in the past. Despite advanced age and mild dementia, he is fairly independent and live in his own house with his dog. He denies any recent fever, chill, lower extremity edema, orthopnea or paroxysmal nocturnal dyspnea. He has no prior bleeding issue.   In the last 6 months, he had decreased oral intake and 13 pound weight loss. His weakness worsened in the last 2 days. According to the patient, he rarely fall before, however on the day of arrival, he was walking in the middle of the room when he fell down. He denies hitting his head, he says he landed on his feet and hands. He was sent to [facility] for further evaluation, on arrival, he was in new atrial fibrillation with heart rate 100. BUN 35, creatinine 1.11, BUN:Cr ratio >20. Troponin was negative.  Lactic acid normal. White blood cell count mildly elevated at 11.1. CT of the head showed no acute intracranial abnormality. He was admitted by internal medicine service, however he spontaneously converted. He was started on Coumadin. TSH was normal. Cardiology consulted for new afib.    Inpatient Medications  . warfarin  5 mg Oral ONCE-1800  . Warfarin - Pharmacist Dosing Inpatient   Does not apply q1800    Family History Family History  Problem Relation Age of Onset  . Colon cancer Neg Hx   . Esophageal cancer Neg Hx   . Stomach cancer Neg Hx   . Alcoholism Father      Social History Social History   Social History  . Marital Status: Widowed    Spouse Name: N/A  . Number of Children: 2  . Years of Education: N/A   Occupational History  . RETIRED    Social History Main Topics  . Smoking status: Former Smoker    Quit date: 09/26/1966  . Smokeless tobacco: Never Used  . Alcohol Use: Yes     Comment: occassional  . Drug Use: No  . Sexual Activity: Not on file   Other Topics Concern  . Not on file   Social History Narrative     Review of Systems  General:  No chills, fever, night sweats or weight changes.  Cardiovascular:  No chest pain, dyspnea on exertion,  edema, orthopnea, palpitations, paroxysmal nocturnal dyspnea. Dermatological: No rash, lesions/masses Respiratory: No cough, dyspnea Urologic: No hematuria, dysuria Abdominal:   No nausea, vomiting, diarrhea, bright red blood per rectum, melena, or hematemesis Neurologic:  No visual changes, wkns, changes in mental status. All other systems reviewed and are otherwise negative except as noted above.  Physical Exam  Blood pressure 138/58, pulse 88, temperature 98.4 F (36.9 C), temperature source Oral, resp. rate 18, height 6' (1.829 m), weight 171 lb 9.6 oz (77.837 kg), SpO2 97 %.  General: Pleasant, NAD Psych: Normal affect. Neuro: Alert and oriented X 3. Moves all extremities spontaneously. HEENT:  Normal  Neck: Supple, no JVD. Loud carotid bruit on right side, mild carotid bruit on left side. Lungs:  Resp regular and unlabored, CTA. Heart: RRR no s3, s4, or murmurs. Abdomen: Soft, non-tender, non-distended, BS + x 4.  Extremities: No clubbing, cyanosis or edema. DP/PT/Radials 2+ and equal bilaterally.  Labs  No results for input(s): CKTOTAL, CKMB, TROPONINI in the last 72 hours. Lab Results  Component Value Date   WBC 11.1* 09/29/2015   HGB 14.2 09/29/2015   HCT 42.2 09/29/2015   MCV 85.8 09/29/2015   PLT 201 09/29/2015    Recent Labs Lab 09/29/15 0003  NA 140  K 4.4  CL 107  CO2 23  BUN 35*  CREATININE 1.11  CALCIUM 9.1  PROT 6.3*  BILITOT 0.7  ALKPHOS 71  ALT 18  AST 23  GLUCOSE 134*   Lab Results  Component Value Date   CHOL  09/21/2010    151        ATP III CLASSIFICATION:  <200     mg/dL   Desirable  200-239  mg/dL   Borderline High  >=240    mg/dL   High          HDL 44 09/21/2010   LDLCALC * 09/21/2010    100        Total Cholesterol/HDL:CHD Risk Coronary Heart Disease Risk Table                     Men   Women  1/2 Average Risk   3.4   3.3  Average Risk       5.0   4.4  2 X Average Risk   9.6   7.1  3 X Average Risk  23.4   11.0        Use the calculated Patient Ratio above and the CHD Risk Table to determine the patient's CHD Risk.        ATP III CLASSIFICATION (LDL):  <100     mg/dL   Optimal  100-129  mg/dL   Near or Above                    Optimal  130-159  mg/dL   Borderline  160-189  mg/dL   High  >190     mg/dL   Very High   TRIG 35 09/21/2010   Lab Results  Component Value Date   DDIMER  09/21/2010    0.42        AT THE INHOUSE ESTABLISHED CUTOFF VALUE OF 0.48 ug/mL FEU, THIS ASSAY HAS BEEN DOCUMENTED IN THE LITERATURE TO HAVE A SENSITIVITY AND NEGATIVE PREDICTIVE VALUE OF AT LEAST 98 TO 99%.  THE TEST RESULT SHOULD BE CORRELATED WITH AN ASSESSMENT OF THE CLINICAL PROBABILITY OF DVT / VTE.     Radiology/Studies  Ct Head Wo Contrast  09/29/2015  CLINICAL DATA:  Generalized weakness. EXAM: CT HEAD WITHOUT CONTRAST TECHNIQUE: Contiguous axial images were obtained from the base of the skull through the vertex without intravenous contrast. COMPARISON:  Head CT and brain MRI May 2007 FINDINGS: Generalized atrophy and chronic small vessel ischemia, stable from prior exam. No intracranial hemorrhage, mass effect, or midline shift. No hydrocephalus. The basilar cisterns are patent. No evidence of territorial infarct. No intracranial fluid collection. Calvarium is intact. Included paranasal sinuses and mastoid air cells are well aerated. IMPRESSION: Stable atrophy and chronic small vessel ischemic change. No acute intracranial abnormality. Electronically Signed   By: Jeb Levering M.D.   On: 09/29/2015 03:03   Dg Chest Port 1 View  09/29/2015  CLINICAL DATA:  Generalized weakness and cough. EXAM: PORTABLE CHEST 1 VIEW COMPARISON:  04/26/2011 FINDINGS: Lower lung volumes from prior exam. Bilateral calcified pleural plaques are again seen. Heart at the upper limits of normal in size. Atherosclerosis of the thoracic aorta. Ill-defined bibasilar opacities, favor atelectasis. No pulmonary edema, large pleural effusion or pneumothorax. IMPRESSION: Low lung volumes with bibasilar opacities, favor atelectasis. Bilateral calcified pleural plaques, grossly unchanged. Electronically Signed   By: Jeb Levering M.D.   On: 09/29/2015 00:51    ECG  NSR with RBBB  ASSESSMENT AND PLAN  1. New paroxysmal atrial fibrillation in the setting of dehydration  - This patients CHA2DS2-VASc Score and unadjusted Ischemic Stroke Rate (% per year) is equal to 3.2 % stroke rate/year from a score of 3 (HTN, age)  - although has advanced age, he rarely fall and has no prior bleeding issue. Coumadin started,    - self rate controlled even in afib, will hold off adding rate control med   - will check echocardiogram to  see EF and LA size. Will consider 30 day event monitor to assess recurrence   2. Carotid bruits: loud bruit heart on right side, also has bruit on left side as well, did not appreciate significant murmur from aortic valve to account for radiation of murmur into carotid region. No prior stroke. Will discuss with MD whether to obtain carotid doppler in this elderly.  3. HTN: not on BP med, SBP 130s which is a good BP for someone of his age.  4. Mild dementia: living independently    Hilbert Corrigan, PA-C 09/29/2015, 7:33 AM   Attending Note:   The patient was seen and examined.  Agree with assessment and plan as noted above.  Changes made to the above note as needed.  1. Paroxysmal atrial fib:    No prior hx of AFib but he rarely goes to the doctor. This episode seems to have occurred in the setting of some dehydration . Improved with IVF. Rate was well controlled.  It's concerning that he has been losing weight recently - this is more worrisome than the PAF I discussed coumadin dosing with him and he does not think that he would be able to do that.   Thinks that he would do better with a fixed dose medication  I think we should start him on Eliquis 5 BID ( could consider 2.5 BID if he has any bleeding )  He does not have a hx of falling.   Apparently "fell" due to weakness just prior to admission but this is thought to be due to dehydration  Agree with echo    2. Failure to thrive.:    Im concerned about his weight loss and progressive weakness.   This  seems to be a much larger issue than his atrial fib.   3. Bilateral carotid artery artery disease:   He has bilateral carotid artery briuts. Will get duplex for assessment.  No hx of CVA   Ramond Dial., MD, [facility] 09/29/2015, 8:20 AM [address],  [place] Pager [phone]

## 2015-09-29 NOTE — H&P (Signed)
History and Physical  Patient Name: William Cisneros     [id number]    DOB: 04-12-1924    DOA: 09/28/2015 Referring physician: Julianne Rice, MD PCP: Velna Hatchet, MD      Chief Complaint: Weakness  HPI: William Cisneros is a 82 y.o. male with a past medical history significant for mild dementia, HTN, and esophageal strictures who presents with weakness.  The patient lives alone but has 2 children who live nearby and check on him daily. Over the last 6 months they have noticed decreased by mouth intake, decreased self-cares, and 13 pounds weight loss. In the past 2 days, the patient describes increasing weakness, dry mouth, and more emesis than usual (the patient has chronic emesis).  He noticed that he was substantially more weak suddenly around noon today, and later in the evening fell and so called 911.  In the ED, the patient was in new atrial fibrillation with rate in the 100s. He had an elevated BUN to creatinine ratio, with urine ketones, but he was mentating well, his electrolytes were normal, and he had negative alcohol, troponin, lactic acid, urine drug screen, CT head.  TRH were asked to admit for new onset A. Fib.     Review of Systems:  Pt complains of weakness, fall, progressive emesis, feeling dehydrated, weight loss, decreased by mouth intake. Pt denies any chest discomfort, chest pressure, palpitations, leg swelling. He denies dysphagia, difficulty swallowing, pain with swallowing, hematemesis. All other systems negative except as just noted or noted in the history of present illness.  No Known Allergies  Prior to Admission medications   Medication Sig Start Date End Date Taking? Authorizing Provider  olmesartan (BENICAR) 40 MG tablet Take 1 tablet (40 mg total) by mouth daily. Patient not taking: Reported on 09/28/2015 08/16/12   Jola Schmidt, MD    Past Medical History  Diagnosis Date  . Hypertension   . Hyperglycemia     pt states that he has  "hypo" glycemia   . COPD (chronic obstructive pulmonary disease) ([place])   . Hypoglycemia     Past Surgical History  Procedure Laterality Date  . Hemorrhoid surgery  1958  . Vein ligation and stripping      rt leg  . Colonoscopy      Family history: family history includes Alcoholism in his father. There is no history of Colon cancer, Esophageal cancer, or Stomach cancer.  Social History: Patient lives alone. 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 He has 2 children who live close by to check on him regularly. He does not use a walker or cane. He recently stopped driving. He does not drink. He is a remote former smoker.       Physical Exam: BP 143/73 mmHg  Pulse 95  Resp 19  Ht 6' (1.829 m)  Wt 77.111 kg (170 lb)  BMI 23.05 kg/m2  SpO2 94% General appearance: Thin elderly male, alert and in no acute distress.  Closes eyes usually, but opens them to talk.   Eyes: Anicteric, conjunctiva pink, lids and lashes normal.     ENT: No nasal deformity, discharge, or epistaxis.  OP moist without lesions.   Lymph: No cervical, supraclavicular or axillary lymphadenopathy. Skin: Warm and dry.  No jaundice.   Cardiac: Mild tachycardia, regular at this time, nl S1-S2, no murmurs appreciated.  Capillary refill is brisk.  JVP normal.  No LE edema.  Radial  and DP pulses 2+ and symmetric. Respiratory: Normal respiratory rate and rhythm.  CTAB without rales or wheezes. Abdomen: Abdomen soft without rigidity.  Mild suprapubic TTP. No ascites, distension.   MSK: No deformities or effusions. Muscle bulk diminished diffusely. Neuro: Sensorium intact and responding to questions, attention normal.  Speech is fluent.  Moves all extremities equally and with normal coordination.   Cranial nerves normal.  Somewhat tangential, but oriented x3 and memory seems mostly intact. Psych: Behavior appropriate.  Affect normal.  No evidence of aural or visual  hallucinations or delusions.       Labs on Admission:  The metabolic panel shows elevated BUN to creatinine ratio, normal serum creatinine. The albumin is low. The transaminases and bilirubin are normal. Alcohol, troponin, lactic acid, and urine drug screens are all negative. The urinalysis shows ketones. The complete blood count shows mild leukocytosis, no anemia or thrombocytopenia.   Radiological Exams on Admission: Personally reviewed: Dg Chest Port 1 View 09/29/2015 Bilateral basilar opacities.   Ct Head Wo Contrast 09/29/2015  IMPRESSION: Stable atrophy and chronic small vessel ischemic change. No acute intracranial abnormality. Electronically Signed   By: Jeb Levering M.D.   On: 09/29/2015 03:03     EKG: Independently reviewed. Atrial fibrillation, rate 85, right bundle branch block.    Assessment/Plan 1. New paroxysmal Afib:  This is new.  CHADs2-Vasc 3.  Annualized risk ~3% and HASBLED score 1 without history of previous bleeding.   I suspect this is provoked by dehydration, as he was already in sinus after fluids when I finished examining him. -Admit to tele -Start warfarin -Consult to Cardiology re: new Afib, paroxysmal -Consult to CM re: coverage of NOAC -Check TSH and magnesium   2. Dehydration:  Elevated BUN-creatinine ratio and hypovolemic by exam.   -NS bolus 1L completed in the ER -Continue NS 125 cc/hr   3. Failure to thrive:  The patient's son notes a 51-month decline in the patient's function and ability to care for himself, as well as a 10 lbs weight loss. -PT and OT evaluation for placement  4. History of HTN:  -Hold olmesartan for now     DVT PPx: Warfarin Diet: Regular Consultants: Cardiology Code Status: Full Family Communication: Son, present at bedside.  History, differential, and expected overnight treatments discussed with son.  All questions answered.  CODE STATUS confirmed.  Medical decision making: What exists of the patient's  previous chart was reviewed in depth and the case was discussed with Dr. Lita Mains. Patient seen 5:11 AM on 09/29/2015.  Disposition Plan:  Admit to tele for new onset afib.  Consult to Cardiology and initiation of anticoagulation per consult.  PT/OT eval for FTT and disposition pending this assessment.      Edwin Dada [facility] Pager [phone]

## 2015-09-29 NOTE — Progress Notes (Addendum)
Initial Nutrition Assessment  DOCUMENTATION CODES:   Severe malnutrition in context of chronic illness  INTERVENTION:  Provide Ensure Enlive po BID, each supplement provides 350 kcal and 20 grams of protein.  Encourage adequate PO intake.   NUTRITION DIAGNOSIS:   Malnutrition related to chronic illness as evidenced by energy intake < or equal to 75% for > or equal to 1 month, severe depletion of body fat, severe depletion of muscle mass.  GOAL:   Patient will meet greater than or equal to 90% of their needs  MONITOR:   PO intake, Supplement acceptance, Weight trends, Labs, I & O's, Skin  REASON FOR ASSESSMENT:   Malnutrition Screening Tool    ASSESSMENT:   82 y.o. male with a past medical history significant for mild dementia, HTN, and esophageal strictures who presents with weakness. Over the last 6 months they have noticed decreased by mouth intake, decreased self-cares, and 13 pounds weight loss. In the past 2 days, the patient describes increasing weakness, dry mouth, and more emesis than usual (the patient has chronic emesis).   Meal completion this AM was 20%. Pt reports having a decreased appetite which has been ongoing over the past 6 months. He reports usually eating 3 meals a day, however portions have been small. He reports one prepared individual dinner brought from the grocery store will last him 2-3 meals. Usual body weight reported to be ~165 lbs. Pt is agreeable to nutritional supplements to aid in caloric and protein needs. RD to order. Pt was encouraged to eat his food at meals.   Nutrition-Focused physical exam completed. Findings are moderate to severe fat depletion, moderate to severe muscle depletion, and no edema.   Labs and medications reviewed.   Diet Order:  Diet Heart Room service appropriate?: Yes; Fluid consistency:: Thin  Skin:  Reviewed, no issues  Last BM:  Unknown  Height:   Ht Readings from Last 1 Encounters:  09/29/15 6' (1.829 m)     Weight:   Wt Readings from Last 1 Encounters:  09/29/15 171 lb 9.6 oz (77.837 kg)    Ideal Body Weight:  80.9 kg  BMI:  Body mass index is 23.27 kg/(m^2).  Estimated Nutritional Needs:   Kcal:  [id number]  Protein:  80-90 grams  Fluid:  1.9 - 2 L/day  EDUCATION NEEDS:   No education needs identified at this time  Corrin Parker, MS, RD, LDN Pager # [phone] After hours/ weekend pager # [phone]

## 2015-09-29 NOTE — Evaluation (Signed)
Occupational Therapy Evaluation Patient Details Name: William Cisneros MRN: [id number] DOB: [birth year] Today's Date: 09/29/2015    History of Present Illness     Clinical Impression   PT admitted with s/p fall with new onset of Afib. Pt currently with functional limitiations due to the deficits listed below (see OT problem list). PTA living alone with children helping PRN ( daughter MWF and son TTH for grocery store trips/ etc) Pt will benefit from skilled OT to increase their independence and safety with adls and balance to allow discharge [place]. OT to follow acutely for cognitive deficits and fall risk  With adls.      Follow Up Recommendations  Home health OT;Supervision/Assistance - 24 hour    Equipment Recommendations  None recommended by OT    Recommendations for Other Services       Precautions / Restrictions Precautions Precautions: Fall      Mobility Bed Mobility               General bed mobility comments: inc hair after PT Ashly session  Transfers Overall transfer level: Modified independent               General transfer comment: however with no awareness to IV and needed cues to stop for IV to be repositioned     Balance Overall balance assessment: Needs assistance         Standing balance support: No upper extremity supported;During functional activity Standing balance-Leahy Scale: Fair                              ADL Overall ADL's : Needs assistance/impaired Eating/Feeding: Independent   Grooming: Oral care;Wash/dry hands;Minimal assistance;Standing Grooming Details (indicate cue type and reason): pt unable to locate tooth brush and tooth paste that were set on counter in clear sight. pt needed cues to sequence oral care properly and undershoot with tooth paste.              Lower Body Dressing: Minimal assistance;Sit to/from stand Lower Body Dressing Details (indicate cue type and reason): needed (A) with gown and  doff pants for toileting Toilet Transfer: Min guard;Ambulation   Toileting- Clothing Manipulation and Hygiene: Minimal assistance;Sit to/from stand       Functional mobility during ADLs: Min guard General ADL Comments: Pt pleasant and agreeable to participate with therapist with cognitive deficits affecting all aspects of adls. pt with cognitive deficits as biggest deficits     Vision Additional Comments: reports getting new glasses and no glasses present   Perception     Praxis      Pertinent Vitals/Pain Pain Assessment: No/denies pain     Hand Dominance Right   Extremity/Trunk Assessment Upper Extremity Assessment Upper Extremity Assessment: Overall WFL for tasks assessed   Lower Extremity Assessment Lower Extremity Assessment: Defer to PT evaluation   Cervical / Trunk Assessment Cervical / Trunk Assessment: Kyphotic   Communication Communication Communication: No difficulties   Cognition Arousal/Alertness: Awake/alert Behavior During Therapy: WFL for tasks assessed/performed Overall Cognitive Status: History of cognitive impairments - at baseline Area of Impairment: Memory;Orientation;Awareness;Safety/judgement Orientation Level: Disoriented to;Situation;Time;Place   Memory: Decreased short-term memory   Safety/Judgement: Decreased awareness of safety;Decreased awareness of deficits Awareness: Intellectual   General Comments: Pt demonstrates urgency to void bowel and bladder with oral care. Pt with urgency and max cues to decr speed to decr fall risk    General Comments       Exercises  Shoulder Instructions      Home Living Family/patient expects to be discharged to:: Private residence Living Arrangements: Alone Available Help at Discharge: Family;Available PRN/intermittently Type of Home: House             Bathroom Shower/Tub: Teacher, early years/pre: Standard     Home Equipment: None   Additional Comments: reports  walking to [employer] alone. pt reports trying to go to the bank alone walking and getting lost. Pt with visual deficits noted. Daughter present for this discussion and appears shocked/ very concerned      Prior Functioning/Environment Level of Independence: Independent        Comments: has a 35 yo dog named lucy. pt very concerned with lucy health and care while away from the house    OT Diagnosis: Generalized weakness;Cognitive deficits   OT Problem List: Decreased strength;Decreased activity tolerance;Impaired balance (sitting and/or standing);Decreased cognition;Decreased safety awareness;Decreased knowledge of use of DME or AE;Decreased knowledge of precautions   OT Treatment/Interventions: Self-care/ADL training;Therapeutic exercise;DME and/or AE instruction;Therapeutic activities;Cognitive remediation/compensation;Patient/family education;Balance training    OT Goals(Current goals can be found in the care plan section) Acute Rehab OT Goals Patient Stated Goal: to go home to my dog OT Goal Formulation: With patient Time For Goal Achievement: 10/13/15 Potential to Achieve Goals: Good  OT Frequency: Min 2X/week   Barriers to D/C: Decreased caregiver support  daughter plans to stay with patient upon dc but lives currently in [place] and son lives in [place]. pt [place]. Pt does not have 24/7 (A) as a long term setup at this time. Daughter more aware of deficits after watching OT session / questinon& Answer about mobility in community alone. Daughter very concerned with patients describing getting lost walking to the bank. Pt agreeable to children helping stay with him at home but wants to make sure his dog [name] is eating. [name] care is most importatn at this time. Patient reports being "tired. I figure i have 2-3 more good years in me and it might be bad to say but thats all i plan to have or want. i dont want to be stuck in a chair. and  my dog is getting older too"       Co-evaluation              End of Session Nurse Communication: Mobility status;Precautions  Activity Tolerance: Patient tolerated treatment well Patient left: in chair;with call bell/phone within reach;with chair alarm set;with family/visitor present   Time: [id number] OT Time Calculation (min): 32 min Charges:  OT General Charges $OT Visit: 1 Procedure OT Evaluation $Initial OT Evaluation Tier I: 1 Procedure OT Treatments $Self Care/Home Management : 8-22 mins G-Codes:    Low complexity  Peri Maris 09/29/2015, 10:03 AM  Jeri Modena   OTR/L PagerIP:[phone] Office: [phone] .

## 2015-09-29 NOTE — ED Notes (Signed)
Son speaking with Dr Loleta Books.

## 2015-09-29 NOTE — Progress Notes (Signed)
Patient seen and examined this morning, admitted overnight with weakness, found to have new onset A fib.  He is no distress, pleasant and endorses already feeling stronger since coming to the floor.    New A fib - CHADs2-Vasc 3. Cardiology consulted, started on Eliquis - obtain 2d echo  Dehydration - gentle IVF, stop after 12 hours - repeat BMP in am   FTT - progressive decline over 6 months, PT to evaluate   HTN - hold BP meds for now  Jannie Doyle M. Cruzita Lederer, MD [facility] [phone]

## 2015-09-29 NOTE — ED Notes (Signed)
Pt stood up to attempt to urinate but was not able to. He states he will try again shortly. Junie Panning, primary RN informed.

## 2015-09-29 NOTE — ED Notes (Signed)
Admitting MD at bedside.

## 2015-09-29 NOTE — Care Management Note (Signed)
Case Management Note  Patient Details  Name: Cashus Wagy MRN: [id number] Date of Birth: [birth year]  Subjective/Objective:         CM following for progression and d/c planning.           Action/Plan: Noted consult for insurance coverage for NOAC, however this would require the name of the medication being prescribed , the dosage etc in order to send a request to the pt insurance provider.   Expected Discharge Date:                  Expected Discharge Plan:  [facility]  In-House Referral:  NA  Discharge planning Services  CM Consult, Medication Assistance  Post Acute Care Choice:    Choice offered to:     DME Arranged:    DME Agency:     HH Arranged:    HH Agency:     Status of Service:  In process, will continue to follow  Medicare Important Message Given:    Date Medicare IM Given:    Medicare IM give by:    Date Additional Medicare IM Given:    Additional Medicare Important Message give by:     If discussed at [place] of Stay Meetings, dates discussed:    Additional Comments:  Adron Bene, RN 09/29/2015, 11:55 AM

## 2015-09-29 NOTE — Evaluation (Signed)
Physical Therapy Evaluation Patient Details Name: William Cisneros MRN: [id number] DOB: 09-11-1924 Today's Date: 09/29/2015   History of Present Illness  William Cisneros is a 82 y.o. male with a past medical history significant for mild dementia, HTN, and esophageal strictures who presents with weakness.  Clinical Impression  Pt admitted with above. Pt living alone PTA with son and daughter checking in PRN. Pt however unsafe to stay home alone as he has decreased insight to safety, poor vision, dementia, and increased falls risk. Pt had a fall this week. Per OT, pt's daughter willing to stay with patient however unsure how long. Recommend pt to transition to ALF if daughter can not live with pt indefinitely.    Follow Up Recommendations Home health PT;Supervision/Assistance - 24 hour    Equipment Recommendations  None recommended by PT    Recommendations for Other Services       Precautions / Restrictions Precautions Precautions: Fall Restrictions Weight Bearing Restrictions: No      Mobility  Bed Mobility Overal bed mobility: Needs Assistance Bed Mobility: Supine to Sit     Supine to sit: Supervision     General bed mobility comments: hob flat, no use of bed rail, supervision due to quick mvmt  Transfers Overall transfer level: Needs assistance Equipment used: None Transfers: Sit to/from Stand Sit to Stand: Supervision         General transfer comment: no physical assist required however superivision due to impulsivity and no awareness of surroundings (iv pole)  Ambulation/Gait Ambulation/Gait assistance: Min guard Ambulation Distance (Feet): 150 Feet Assistive device: None Gait Pattern/deviations: Step-through pattern;Drifts right/left;Trunk flexed Gait velocity: impulsivly quick   General Gait Details: pt unsteady but no overt episodes of LOB. Pt unable to navigate hallway and find room. pt unable to read EXIT signs. pt drifting L/R  Stairs             Wheelchair Mobility    Modified Rankin (Stroke Patients Only)       Balance Overall balance assessment: Needs assistance         Standing balance support: No upper extremity supported Standing balance-Leahy Scale: Fair                               Pertinent Vitals/Pain Pain Assessment: No/denies pain    Home Living Family/patient expects to be discharged to:: Private residence Living Arrangements: Alone Available Help at Discharge: Family;Available PRN/intermittently Type of Home: House Home Access: Stairs to enter Entrance Stairs-Rails: Right Entrance Stairs-Number of Steps: 5 Home Layout: One level Home Equipment: None Additional Comments: reports walking to [employer] alone. pt reports trying to go to the bank alone walking and getting lost. Pt with visual deficits noted. Daughter present for this discussion and appears shocked/ very concerned    Prior Function Level of Independence: Independent         Comments: has a 73 yo dog named lucy. pt very concerned with lucy health and care while away from the house     Hand Dominance   Dominant Hand: Right    Extremity/Trunk Assessment   Upper Extremity Assessment: Overall WFL for tasks assessed           Lower Extremity Assessment: Generalized weakness      Cervical / Trunk Assessment: Kyphotic  Communication   Communication: No difficulties  Cognition Arousal/Alertness: Awake/alert Behavior During Therapy: WFL for tasks assessed/performed Overall Cognitive Status: History of cognitive impairments - at  baseline Area of Impairment: Memory;Orientation;Awareness;Safety/judgement Orientation Level: Disoriented to;Situation;Time;Place   Memory: Decreased short-term memory   Safety/Judgement: Decreased awareness of safety;Decreased awareness of deficits Awareness: Intellectual   General Comments: Pt demonstrates urgency to void bowel and bladder with oral care. Pt with urgency and  max cues to decr speed to decr fall risk     General Comments General comments (skin integrity, edema, etc.): pt with tangently speech and easily distracted    Exercises        Assessment/Plan    PT Assessment Patient needs continued PT services  PT Diagnosis Difficulty walking;Generalized weakness   PT Problem List Decreased strength;Decreased range of motion;Decreased activity tolerance;Decreased balance;Decreased mobility  PT Treatment Interventions DME instruction;Gait training;Stair training;Functional mobility training;Therapeutic activities;Therapeutic exercise;Balance training   PT Goals (Current goals can be found in the Care Plan section) Acute Rehab PT Goals Patient Stated Goal: to go home to my dog PT Goal Formulation: Patient unable to participate in goal setting Time For Goal Achievement: 10/06/15 Potential to Achieve Goals: Good Additional Goals Additional Goal #1: Pt to score >19 on DGI to indicate minimal fall risk.    Frequency Min 3X/week   Barriers to discharge Decreased caregiver support lives alone    Co-evaluation               End of Session Equipment Utilized During Treatment: Gait belt Activity Tolerance: Patient tolerated treatment well Patient left: in chair;with call bell/phone within reach;with chair alarm set Nurse Communication: Mobility status         Time: [id number] PT Time Calculation (min) (ACUTE ONLY): 21 min   Charges:   PT Evaluation $PT Eval Moderate Complexity: 1 Procedure     PT G CodesKingsley Callander 09/29/2015, 11:07 AM   Kittie Plater, PT, DPT Pager #: [phone] Office #: [phone]

## 2015-09-29 NOTE — Progress Notes (Signed)
New Admission Note:  Arrival Method: via stretcher with nurse Tech Mental Orientation: Alert & oriented x4 Telemetry: box 23 Assessment: Completed Skin: Ecchymosis on arms and back IV: left forearm Pain: denies any pain Tubes: Safety Measures: Safety Fall Prevention Plan was given, discussed and signed. Admission: initiated [place] Orientation: Patient has been orientated to the room, unit and the staff. Family: son at bedside  Orders have been reviewed and implemented. Will continue to monitor the patient. Call light has been placed within reach and bed alarm has been activated.   Leandro Reasoner BSN, RN  Phone Number: [phone] [facility]

## 2015-09-29 NOTE — Progress Notes (Signed)
  Echocardiogram 2D Echocardiogram has been performed.  Jennette Dubin 09/29/2015, 10:43 AM

## 2015-09-30 ENCOUNTER — Inpatient Hospital Stay (HOSPITAL_COMMUNITY): Payer: Medicare Other

## 2015-09-30 DIAGNOSIS — R531 Weakness: Secondary | ICD-10-CM

## 2015-09-30 DIAGNOSIS — I10 Essential (primary) hypertension: Secondary | ICD-10-CM

## 2015-09-30 DIAGNOSIS — E43 Unspecified severe protein-calorie malnutrition: Secondary | ICD-10-CM

## 2015-09-30 LAB — BASIC METABOLIC PANEL
Anion gap: 6 (ref 5–15)
BUN: 21 mg/dL — AB (ref 6–20)
CHLORIDE: 108 mmol/L (ref 101–111)
CO2: 24 mmol/L (ref 22–32)
CREATININE: 1.22 mg/dL (ref 0.61–1.24)
Calcium: 8.3 mg/dL — ABNORMAL LOW (ref 8.9–10.3)
GFR calc Af Amer: 59 mL/min — ABNORMAL LOW (ref >60)
GFR calc non Af Amer: 51 mL/min — ABNORMAL LOW (ref >60)
Glucose, Bld: 86 mg/dL (ref 65–99)
Potassium: 3.8 mmol/L (ref 3.5–5.1)
SODIUM: 138 mmol/L (ref 135–145)

## 2015-09-30 LAB — CBC
HCT: 37.5 % — ABNORMAL LOW (ref 39.0–52.0)
HEMOGLOBIN: 12.5 g/dL — AB (ref 13.0–17.0)
MCH: 28.4 pg (ref 26.0–34.0)
MCHC: 33.3 g/dL (ref 30.0–36.0)
MCV: 85.2 fL (ref 78.0–100.0)
Platelets: 205 10*3/uL (ref 150–400)
RBC: 4.4 MIL/uL (ref 4.22–5.81)
RDW: 15.3 % (ref 11.5–15.5)
WBC: 7.8 10*3/uL (ref 4.0–10.5)

## 2015-09-30 MED ORDER — INFLUENZA VAC SPLIT QUAD 0.5 ML IM SUSY: 0.5000 mL | PREFILLED_SYRINGE | INTRAMUSCULAR | Status: DC

## 2015-09-30 MED ORDER — ENSURE ENLIVE PO LIQD: 237.0000 mL | Freq: Two times a day (BID) | ORAL | Status: DC

## 2015-09-30 MED ORDER — APIXABAN 5 MG PO TABS: 5.0000 mg | ORAL_TABLET | Freq: Two times a day (BID) | ORAL | Status: DC

## 2015-09-30 NOTE — Progress Notes (Signed)
Occupational Therapy Treatment Patient Details Name: William Cisneros MRN: [id number] DOB: [birth year] Today's Date: 09/30/2015    History of present illness William Cisneros is a 82 y.o. male with a past medical history significant for mild dementia, HTN, and esophageal strictures who presents with weakness.   OT comments  Family requesting more information about caregivers and willing to pay. CM William Cisneros to speak with family. Daughter this session verbalized desire to return to her own home. Son present and exploring options and wanting more details from CM. All parties want patient to return home at this time.    Follow Up Recommendations  Home health OT;Supervision/Assistance - 24 hour    Equipment Recommendations  None recommended by OT    Recommendations for Other Services      Precautions / Restrictions Precautions Precautions: Fall       Mobility Bed Mobility Overal bed mobility: Needs Assistance Bed Mobility: Supine to Sit;Sit to Supine     Supine to sit: Supervision Sit to supine: Min guard   General bed mobility comments: bracing against bed with initial standing  Transfers Overall transfer level: Needs assistance   Transfers: Sit to/from Stand Sit to Stand: Supervision         General transfer comment: bracing against bed for support    Balance Overall balance assessment: Needs assistance             Standing balance comment: pt with LOB ( R Le tripping) with min (A) to correct. pt unable to retrace path back to room with only two turns added to a straight ambulation.              High level balance activites: Other (comment)     ADL Overall ADL's : Needs assistance/impaired                     Lower Body Dressing: Supervision/safety               Functional mobility during ADLs: Minimal assistance (x1 LOB) General ADL Comments: Pt and family agreeable to information on caregivers. Daughter present this session and asked if she  still planned to help patient upon d/c. Daughters states "i am going home to be at my house i havent been there in 12 days" Son present and states "we will figure it out but he really wants to go back home" Family willing to pay for caregiver  family and pt educated on concern with falling with new medications with risk of bleeding      Vision                     Perception     Praxis      Cognition   Behavior During Therapy: WFL for tasks assessed/performed Overall Cognitive Status: History of cognitive impairments - at baseline Area of Impairment: Memory;Orientation;Awareness;Safety/judgement Orientation Level: Disoriented to;Situation;Time;Place   Memory: Decreased short-term memory    Safety/Judgement: Decreased awareness of safety;Decreased awareness of deficits Awareness: Intellectual   General Comments: Pt unable to locate room but did problem solve using room numbers that he was ambulating the wrong direction. Pt reports to children "i went the wrong way and go turned around"    Extremity/Trunk Assessment               Exercises     Shoulder Instructions       General Comments      Pertinent Vitals/ Pain       Pain  Assessment: No/denies pain  Home Living                                          Prior Functioning/Environment              Frequency Min 2X/week     Progress Toward Goals  OT Goals(current goals can now be found in the care plan section)  Progress towards OT goals: Progressing toward goals  Acute Rehab OT Goals Patient Stated Goal: to go home to my dog OT Goal Formulation: With patient Time For Goal Achievement: 10/13/15 ADL Goals Pt Will Perform Grooming: with supervision;standing Pt Will Perform Upper Body Bathing: with supervision;sitting Pt Will Transfer to Toilet: with supervision;regular height toilet;ambulating Pt Will Perform Tub/Shower Transfer: Tub transfer;with supervision;shower  seat;ambulating Additional ADL Goal #1: Pt will complete DGI with score > 19 to decr fall risk with adls  Plan Discharge plan remains appropriate    Co-evaluation                 End of Session Equipment Utilized During Treatment: Gait belt   Activity Tolerance Patient tolerated treatment well   Patient Left in bed;with call bell/phone within reach;with bed alarm set;with family/visitor present   Nurse Communication Mobility status;Precautions        Time: [id number] OT Time Calculation (min): 21 min  Charges: OT General Charges $OT Visit: 1 Procedure OT Treatments $Therapeutic Activity: 8-22 mins  William Cisneros 09/30/2015, 12:54 PM   William Cisneros   OTR/L Pager: [phone] Office: [phone] .

## 2015-09-30 NOTE — Progress Notes (Signed)
No repeat episodes of hemoptysis.Chest -xray normal.M.D. Made aware.[place] papers printed,explained and given to the patient and his son.Questions were satisfactorily at the time of discharged.

## 2015-09-30 NOTE — Care Management Note (Addendum)
Case Management Note  Patient Details  Name: William Cisneros MRN: [id number] Date of Birth: 1924/02/08  Subjective/Objective:         CM following for progression and d/c planning.           Action/Plan: 09/30/2015 Met with pt daughter and gave her the 30 day free card for Eliquis. Also explained that copay may be $24 per month per our call to pt insurance provider.  Met with pt children, son and daughter who are concerned re his compliance. They have hired [facility] aides previously and the pt has often sent then away after a short period. They are also concerned that he will not take meds a prescribed. Currently they check on him frequently and his daughter goes to his home daily, they prepare a pill box and he still fails to take meds unless reminded. This CM suggest a HHRN for medication management and they are concerned that he will continue his practice of not taking the medication. This CM provided them with a list of local agencies with [facility] aides and they are also researching some other options. Will make a referral to [facility] for possible followup.   Expected Discharge Date:     09/29/2014             Expected Discharge Plan:  [place]  In-House Referral:  NA  Discharge planning Services  CM Consult, Medication Assistance  Post Acute Care Choice:   NA Choice offered to:   NA  DME Arranged:   NA DME Agency:   NA  HH Arranged:   NA HH Agency:   NA  Status of Service:  Complete  Medicare Important Message Given:    Date Medicare IM Given:    Medicare IM give by:    Date Additional Medicare IM Given:    Additional Medicare Important Message give by:     If discussed at [place] of Stay Meetings, dates discussed:    Additional Comments:  Adron Bene, RN 09/30/2015, 11:11 AM

## 2015-09-30 NOTE — Discharge Instructions (Signed)

## 2015-09-30 NOTE — Progress Notes (Signed)
PROGRESS NOTE  Subjective:   82 yo male with PMH of HTN, mild dementia and esophageal stricture who presented to [facility] on 09/28/2015 with dehydration and found to have new afib (self rate controlled without AV nodal blocking agent  Started on eliquis yesterday  Talked with patient and daughter.    Objective:    Vital Signs:   Temp:  [97.3 F (36.3 C)-98.4 F (36.9 C)] 97.3 F (36.3 C) (01/04 0456) Pulse Rate:  [72-82] 72 (01/04 0456) Resp:  [17-18] 17 (01/04 0456) BP: (112-122)/(43-72) 116/72 mmHg (01/04 0456) SpO2:  [92 %-96 %] 93 % (01/04 0456) Weight:  [171 lb 8.3 oz (77.8 kg)] 171 lb 8.3 oz (77.8 kg) (01/03 2056)  Last BM Date: 09/29/15   24-hour weight change: Weight change: 1 lb 8.3 oz (0.689 kg)  Weight trends: Filed Weights   09/28/15 2230 09/29/15 0526 09/29/15 2056  Weight: 170 lb (77.111 kg) 171 lb 9.6 oz (77.837 kg) 171 lb 8.3 oz (77.8 kg)    Intake/Output:  01/03 0701 - 01/04 0700 In: 720 [P.O.:720] Out: 200 [Urine:200]     Physical Exam: BP 116/72 mmHg  Pulse 72  Temp(Src) 97.3 F (36.3 C) (Oral)  Resp 17  Ht 6' (1.829 m)  Wt 171 lb 8.3 oz (77.8 kg)  BMI 23.26 kg/m2  SpO2 93%  Wt Readings from Last 3 Encounters:  09/29/15 171 lb 8.3 oz (77.8 kg)  08/04/14 170 lb (77.111 kg)  05/05/14 160 lb (72.576 kg)    General: Vital signs reviewed and noted.   Head: Normocephalic, atraumatic.  Eyes: conjunctivae/corneas clear.  EOM's intact.   Throat: normal  Neck:  bilateral carotid bruits, right > left   Lungs:    clear   Heart:  RR   Abdomen:  Soft, non-tender, non-distended    Extremities: No edema   Neurologic: A&O X3, CN II - XII are grossly intact.   Psych: Normal     Labs: BMET:  Recent Labs  09/29/15 0003 09/29/15 0601 09/30/15 0520  NA 140  --  138  K 4.4  --  3.8  CL 107  --  108  CO2 23  --  24  GLUCOSE 134*  --  86  BUN 35*  --  21*  CREATININE 1.11  --  1.22  CALCIUM 9.1  --  8.3*  MG  --  1.8  --     Liver  function tests:  Recent Labs  09/29/15 0003  AST 23  ALT 18  ALKPHOS 71  BILITOT 0.7  PROT 6.3*  ALBUMIN 3.3*   No results for input(s): LIPASE, AMYLASE in the last 72 hours.  CBC:  Recent Labs  09/29/15 0003 09/30/15 0520  WBC 11.1* 7.8  NEUTROABS 10.2*  --   HGB 14.2 12.5*  HCT 42.2 37.5*  MCV 85.8 85.2  PLT 201 205    Cardiac Enzymes: No results for input(s): CKTOTAL, CKMB, TROPONINI in the last 72 hours.  Coagulation Studies:  Recent Labs  09/29/15 1428  LABPROT 15.6*  INR 1.22    Other: Invalid input(s): POCBNP No results for input(s): DDIMER in the last 72 hours. No results for input(s): HGBA1C in the last 72 hours. No results for input(s): CHOL, HDL, LDLCALC, TRIG, CHOLHDL in the last 72 hours.  Recent Labs  09/29/15 0601  TSH 2.241   No results for input(s): VITAMINB12, FOLATE, FERRITIN, TIBC, IRON, RETICCTPCT in the last 72 hours.   Other results:  Tele   (  personally reviewed )    NSR , occasional Mobitz type 1 AVB     Medications:    Infusions:    Scheduled Medications: . apixaban  5 mg Oral BID  . feeding supplement (ENSURE ENLIVE)  237 mL Oral BID BM  . [START ON 10/01/2015] Influenza vac split quadrivalent PF  0.5 mL Intramuscular Tomorrow-1000    Assessment/ Plan:   Principal Problem:   Paroxysmal atrial fibrillation (HCC) Active Problems:   Hypertension   Failure to thrive in adult   Weakness   Atrial fibrillation (HCC)   Protein-calorie malnutrition, severe  1. Paroxysmal atrial fib:   Seems to be better with IVF Normal LV function, EF 60-65%.  Grade 1 diastolic dysfunction  Mild TR   2. [place] - no follow up needed at this pint 3. Carotid artery disease;  Moderate stenosis ( 60-79%) on right, mild - moderate stenosis ( 40-59%) on left Continue to observe.  Asymptmatic.    Disposition:  Length of Stay: 1  Thayer Headings, Brooke Bonito., MD, [facility] 09/30/2015, 8:13 AM Office [phone] Pager [phone]

## 2015-09-30 NOTE — Progress Notes (Signed)
Patient's son said that his father received flu shot vaccination this season.

## 2015-10-02 ENCOUNTER — Other Ambulatory Visit: Payer: Self-pay

## 2015-10-02 NOTE — Discharge Summary (Addendum)
[facility] Discharge Summary   Patient: William Cisneros [id number]   PCP: Velna Hatchet, MD DOB: [birth year]   Date of admission: 09/28/2015   Date of discharge: 09/30/2015    Discharge Diagnoses:  Principal Problem:   Paroxysmal atrial fibrillation (HCC) Active Problems:   Hypertension   Failure to thrive in adult   Weakness   Atrial fibrillation (HCC)   Protein-calorie malnutrition, severe  Recommendations for Outpatient Follow-up:  1.  Follow-up with PCP in one week regarding hypertension management  Diet recommendation: Low sodium, encourage fluid  Activity: The patient is advised to gradually reintroduce usual activities.  Discharge Condition: good  History of present illness: As per the H and P dictated on admission, "patient presented with decreased oral intake with weight loss and generalized weakness. Patient mentions while he was walking he lost his balance and turgor something and fell. He denies any passing out episode and no dizziness no lightheadedness no chest pain or palpitation. Denies any shortness of breath at rest. Or exertion."  Hospital Course:  Summary of his active problems in the hospital is as following. Principal Problem:   Paroxysmal atrial fibrillation (HCC) New-onset atrial fibrillation, ejection fraction 60-65% with no wall motion normality and diastolic dysfunction. Cardiology was consulted. Patient converted to normal sinus rhythm spontaneously. His blood pressure and heart rate remained stable in the hospital and therefore he'll be discharged without any rate controlling medication. Patient was started on anticoagulation considering high risk for stroke.  Patient did have an episode of bloody sputum, chest x-ray was normal patient was not hypoxic vitals remained stable and patient did not have any further episodes of cough.  Active Problems:   Hypertension Blood pressure remained stable in the hospital. He was discharged without any  blood pressure medication.    Failure to thrive in adult   Weakness   Protein-calorie malnutrition, severe Patient had significant weight loss and meets the criteria for severe protein calorie malnutrition. Patient with discharge and nutritional supplement. Recommend patient to increase oral intake at home and as well as increased fluid. As per the family patient's primary diet is cookies, commended patient to increase nutritional content in the diet.   All other chronic medical condition were stable during the hospitalization.  Patient was seen by physical therapy, who recommended home health which was arranged by Education officer, museum and case Freight forwarder. On the day of the discharge the patient's vitals remained stable, and no other acute medical condition were reported by patient. the patient was felt safe to be discharge at [place]  with [place].  Procedures and Results:   Echocardiogram ejection fraction [id number], grade 1 diastolic dysfunction  Consultations:  Cardiology  Discharge Exam: Filed Weights   09/28/15 2230 09/29/15 0526 09/29/15 2056  Weight: 77.111 kg (170 lb) 77.837 kg (171 lb 9.6 oz) 77.8 kg (171 lb 8.3 oz)   Filed Vitals:   09/30/15 1008 09/30/15 1730  BP: 129/68 90/69  Pulse: 69 80  Temp: 97.7 F (36.5 C) 97.6 F (36.4 C)  Resp: 17 16   General: Appear in NO distress, NO Rash; Oral Mucosa moist. Cardiovascular: S1 and S2 Present, NO Murmur, NO JVD Respiratory: Bilateral Air entry present and Clear to Auscultation, NO Crackles, NO wheezes Abdomen: Bowel Sound PREsent, Soft and NO tenderness Extremities: NO Pedal edema, NO calf tenderness Neurology: Grossly no focal neuro deficit.  DISCHARGE MEDICATION: Discharge Instructions    Diet 2 gram sodium    Complete by:  As directed  Discharge instructions    Complete by:  As directed   It is important that you read following instructions as well as go over your medication list with RN to help you understand your  care after this hospitalization.  Discharge Instructions: Please follow up with Velna Hatchet, MD for hypertension and repeat CBC blood work.   Please request your primary care physician to go over all Hospital Tests and Procedure/Radiological results at the follow up,  Please get all Hospital records sent to your PCP by signing hospital release before you go home.    You were cared for by a hospitalist during your hospital stay. If you have any questions about your discharge medications or the care you received while you were in the hospital after you are discharged, you can call the unit and ask to speak with the hospitalist on call if the hospitalist that took care of you is not available.  Once you are discharged, your primary care physician will handle any further medical issues. Please note that NO REFILLS for any discharge medications will be authorized once you are discharged, as it is imperative that you return to your primary care physician (or establish a relationship with a primary care physician if you do not have one) for your aftercare needs so that they can reassess your need for medications and monitor your lab values. You Must read complete instructions/literature along with all the possible adverse reactions/side effects for all the Medicines you take and that have been prescribed to you. Take any new Medicines after you have completely understood and accept all the possible adverse reactions/side effects. Wear Seat belts while driving. If you have smoked or chewed Tobacco in the last 2 yrs please stop smoking and/or stop any Recreational drug use.     Encourage fluids    Complete by:  As directed      Increase activity slowly    Complete by:  As directed           Discharge Medication List as of 09/30/2015  6:03 PM    START taking these medications   Details  apixaban (ELIQUIS) 5 MG TABS tablet Take 1 tablet (5 mg total) by mouth 2 (two) times daily., Starting 09/30/2015,  Until Discontinued, Normal    feeding supplement, ENSURE ENLIVE, (ENSURE ENLIVE) LIQD Take 237 mLs by mouth 2 (two) times daily between meals., Starting 09/30/2015, Until Discontinued, Normal      STOP taking these medications     olmesartan (BENICAR) 40 MG tablet        No Known Allergies Follow-up Information    Follow up with Velna Hatchet, MD In 1 week.   Specialty:  Internal Medicine   Why:  CBC and hypertension and weight loss   Contact information:   [address] [place] [address] [phone]       Follow up with Nahser, Wonda Cheng, MD. Call in 1 week.   Specialty:  Cardiology   Why:  for follow up.    Contact information:   [place] [address] [place] [address] [phone]       The results of significant diagnostics from this hospitalization (including imaging, microbiology, ancillary and laboratory) are listed below for reference.    Significant Diagnostic Studies: Dg Chest 2 View  09/30/2015  CLINICAL DATA:  Cough and hemoptysis today. EXAM: CHEST  2 VIEW COMPARISON:  09/28/2015 FINDINGS: The cardiomediastinal silhouette is unchanged and within normal limits. The patient has taken a greater  inspiration than on the prior study, and there is improved aeration of the lung bases. Subsegmental atelectasis is present in the left lung base. Calcified pleural plaques are noted bilaterally. Trace pleural effusions are questioned. No pneumothorax is identified. No acute osseous abnormality is seen. IMPRESSION: 1. Improved aeration of the lung bases. Residual left basilar subsegmental atelectasis. 2. Possible trace bilateral pleural effusions. Electronically Signed   By: Logan Bores M.D.   On: 09/30/2015 16:06   Ct Head Wo Contrast  09/29/2015  CLINICAL DATA:  Generalized weakness. EXAM: CT HEAD WITHOUT CONTRAST TECHNIQUE: Contiguous axial images were obtained from the base of the skull through the vertex without intravenous contrast. COMPARISON:  Head CT and  brain MRI May 2007 FINDINGS: Generalized atrophy and chronic small vessel ischemia, stable from prior exam. No intracranial hemorrhage, mass effect, or midline shift. No hydrocephalus. The basilar cisterns are patent. No evidence of territorial infarct. No intracranial fluid collection. Calvarium is intact. Included paranasal sinuses and mastoid air cells are well aerated. IMPRESSION: Stable atrophy and chronic small vessel ischemic change. No acute intracranial abnormality. Electronically Signed   By: Jeb Levering M.D.   On: 09/29/2015 03:03   Dg Chest Port 1 View  09/29/2015  CLINICAL DATA:  Generalized weakness and cough. EXAM: PORTABLE CHEST 1 VIEW COMPARISON:  04/26/2011 FINDINGS: Lower lung volumes from prior exam. Bilateral calcified pleural plaques are again seen. Heart at the upper limits of normal in size. Atherosclerosis of the thoracic aorta. Ill-defined bibasilar opacities, favor atelectasis. No pulmonary edema, large pleural effusion or pneumothorax. IMPRESSION: Low lung volumes with bibasilar opacities, favor atelectasis. Bilateral calcified pleural plaques, grossly unchanged. Electronically Signed   By: Jeb Levering M.D.   On: 09/29/2015 00:51    Microbiology: No results found for this or any previous visit (from the past 240 hour(s)).   Labs: CBC:  Recent Labs Lab 09/29/15 0003 09/30/15 0520  WBC 11.1* 7.8  NEUTROABS 10.2*  --   HGB 14.2 12.5*  HCT 42.2 37.5*  MCV 85.8 85.2  PLT 201 [id number]   Basic Metabolic Panel:  Recent Labs Lab 09/29/15 0003 09/29/15 0601 09/30/15 0520  NA 140  --  138  K 4.4  --  3.8  CL 107  --  108  CO2 23  --  24  GLUCOSE 134*  --  86  BUN 35*  --  21*  CREATININE 1.11  --  1.22  CALCIUM 9.1  --  8.3*  MG  --  1.8  --    Liver Function Tests:  Recent Labs Lab 09/29/15 0003  AST 23  ALT 18  ALKPHOS 71  BILITOT 0.7  PROT 6.3*  ALBUMIN 3.3*   Time spent: 30 minutes  Signed:  Josceline Chenard  [facility] 09/30/2015,  2:52 PM

## 2015-10-02 NOTE — Patient Outreach (Signed)
[facility]) Care Management  10/02/2015  William Cisneros [birth year] [id number]  Assessment: Transition of care call. Member discharged from the hospital 1/4 with atrial fibrillation, failure to thrive. Member denies any issues or concerns at this time. RNCM discussed the [facility] Program, but member is refusing stating someone from an agency came to his house this morning. He is unable to state the agency and says he does not feel he needs it, but states if his son/daughter sets it up then he will comply. RNCM asked permission to contact his son, William Cisneros. Member has given permission to speak with member's son.  William Cisneros is without questions or concerns at this time, reports he has all his medications ordered at discharge and is taking them.  RNCM called William Cisneros answer unable to leave message.  Plan: update assigned care coordinator for follow up call next week.  William Silversmith, RN, MSN, [place] Coordinator Cell: [phone]

## 2015-10-05 ENCOUNTER — Encounter: Payer: Self-pay | Admitting: *Deleted

## 2015-10-05 ENCOUNTER — Other Ambulatory Visit: Payer: Self-pay | Admitting: *Deleted

## 2015-10-05 NOTE — Patient Outreach (Signed)
[facility]) Care Management  10/05/2015  Ryn Skubal 1924-04-16 [id number]   Assessment: Transition of care follow-up call Referral from in-patient [facility] 6 E RN) due to non-compliance. Patient had recent hospitalization on 09/28/15 - 09/30/15 for Atrial fibrillation, hypertension and failure to thrive.  Call placed to patient's son [name]) to follow-up on the call made last week due to patient's refusal to [facility] services. Spoke to patient's son. Care management coordinator introduced self and explained purpose of the call. Explained to son about patient's eligibility to the program as well. Patient's son verbalized to care management coordinator that he "does not think that patient needs any more services at this particular point". Son reports that patient has follow-up appointments with his doctors starting tomorrow 1/10 with his primary care provider (Dr. Ardeth Perfect) which will include follow-up on CBC, hypertension and weight loss including post hospital follow-up.  Son states, " we've got things covered". He verbalized that he provides transportation for patient's doctors' appointments.  He reports that patient's daughter provides medication management since she is at patient's house every morning. In- home caregiver from [facility] stays with patient daily from at 3 pm to 6 pm. He reports that they are currently working on patient's diet (2 gram sodium) and encouraging increase fluid intake.  Per son, patient's needs are "pretty much covered" at this point and no need for anymore".  Provided son of [facility], care management coordinator and 24-hour nurse line contact informations and agreed to call if they change their minds or if any further needs arise. He was also made aware that case will be closed for now and primary care provider will be notified. Son is aware to request from primary care provider for referral back to [facility] care management as necessary.   Patient's son expressed thanks  for time and effort provided to them.   Plan: Will close case. Will notify primary care provider of case closure.  Soma Lizak A. Ermalinda Joubert, BSN, RN-BC [place] Management Coordinator Cell: [phone]

## 2015-10-06 ENCOUNTER — Encounter: Payer: Self-pay | Admitting: Cardiovascular Disease

## 2015-10-06 DIAGNOSIS — I10 Essential (primary) hypertension: Secondary | ICD-10-CM | POA: Diagnosis not present

## 2015-10-12 DIAGNOSIS — B0229 Other postherpetic nervous system involvement: Secondary | ICD-10-CM | POA: Diagnosis not present

## 2015-10-12 DIAGNOSIS — E43 Unspecified severe protein-calorie malnutrition: Secondary | ICD-10-CM | POA: Diagnosis not present

## 2015-10-12 DIAGNOSIS — Z6821 Body mass index (BMI) 21.0-21.9, adult: Secondary | ICD-10-CM | POA: Diagnosis not present

## 2015-10-12 DIAGNOSIS — Z0000 Encounter for general adult medical examination without abnormal findings: Secondary | ICD-10-CM | POA: Diagnosis not present

## 2015-10-12 DIAGNOSIS — Z1389 Encounter for screening for other disorder: Secondary | ICD-10-CM | POA: Diagnosis not present

## 2015-10-12 DIAGNOSIS — I480 Paroxysmal atrial fibrillation: Secondary | ICD-10-CM | POA: Diagnosis not present

## 2015-10-12 DIAGNOSIS — R1310 Dysphagia, unspecified: Secondary | ICD-10-CM | POA: Diagnosis not present

## 2015-10-12 DIAGNOSIS — I6529 Occlusion and stenosis of unspecified carotid artery: Secondary | ICD-10-CM | POA: Diagnosis not present

## 2015-10-12 DIAGNOSIS — N182 Chronic kidney disease, stage 2 (mild): Secondary | ICD-10-CM | POA: Diagnosis not present

## 2015-10-15 ENCOUNTER — Encounter: Payer: Self-pay | Admitting: Cardiovascular Disease

## 2015-10-15 ENCOUNTER — Ambulatory Visit (INDEPENDENT_AMBULATORY_CARE_PROVIDER_SITE_OTHER): Payer: Medicare Other | Admitting: Cardiovascular Disease

## 2015-10-15 VITALS — BP 124/68 | HR 58 | Ht 72.0 in | Wt 151.0 lb

## 2015-10-15 DIAGNOSIS — I480 Paroxysmal atrial fibrillation: Secondary | ICD-10-CM

## 2015-10-15 DIAGNOSIS — R627 Adult failure to thrive: Secondary | ICD-10-CM

## 2015-10-15 MED ORDER — APIXABAN 5 MG PO TABS: 5.0000 mg | ORAL_TABLET | Freq: Two times a day (BID) | ORAL | Status: DC

## 2015-10-15 NOTE — Progress Notes (Signed)
Cardiology Office Note   Date:  10/15/2015   ID:  William Cisneros, DOB May 18, 1924, MRN [id number]  PCP:  Velna Hatchet, MD  Cardiologist:   Thayer Headings, MD   Chief Complaint  Patient presents with  . Follow-up    atrial fib, carotid artery disease.       Problem list 1. Paroxysmal atrial fibrillation 2. Weight loss /  Failure to thrive 3. Poor by mouth intake   History of Present Illness: William Cisneros is a 82 y.o. male who presents for follow up of a recent hospitalization for paroxysmal atrial fib. Thought to be due to volume depletion  Family members have been tag-teaming - trying to have someone with him frequently through the day  His son William Cisneros ) reported that he thinks he as gained some weight since comign home .   Wt Readings from Last 3 Encounters:  10/15/15 151 lb (68.493 kg)  09/29/15 171 lb 8.3 oz (77.8 kg)  08/04/14 170 lb (77.111 kg)      Past Medical History  Diagnosis Date  . Hypertension   . Hyperglycemia     pt states that he has  "hypo" glycemia  . COPD (chronic obstructive pulmonary disease) ([place])   . Hypoglycemia     Past Surgical History  Procedure Laterality Date  . Hemorrhoid surgery  1958  . Vein ligation and stripping      rt leg  . Colonoscopy       Current Outpatient Prescriptions  Medication Sig Dispense Refill  . apixaban (ELIQUIS) 5 MG TABS tablet Take 1 tablet (5 mg total) by mouth 2 (two) times daily. 60 tablet 3  . feeding supplement, ENSURE ENLIVE, (ENSURE ENLIVE) LIQD Take 237 mLs by mouth 2 (two) times daily between meals. 237 mL 12  . [DISCONTINUED] Olmesartan-Amlodipine-HCTZ (TRIBENZOR) 40-5-12.5 MG TABS Take 1 tablet by mouth daily.      No current facility-administered medications for this visit.    Allergies:   Review of patient's allergies indicates no known allergies.    Social History:  The patient  reports that he quit smoking about 49 years ago. He has never used smokeless tobacco. He reports  that he drinks alcohol. He reports that he does not use illicit drugs.   Family History:  The patient's family history includes Alcoholism in his father. There is no history of Colon cancer, Esophageal cancer, or Stomach cancer.    ROS:  Please see the history of present illness.    Review of Systems: Constitutional:  denies fever, chills, diaphoresis, appetite change and fatigue.  HEENT: denies photophobia, eye pain, redness, hearing loss, ear pain, congestion, sore throat, rhinorrhea, sneezing, neck pain, neck stiffness and tinnitus.  Respiratory: denies SOB, DOE, cough, chest tightness, and wheezing.  Cardiovascular: denies chest pain, palpitations and leg swelling.  Gastrointestinal: denies nausea, vomiting, abdominal pain, diarrhea, constipation, blood in stool.  Genitourinary: denies dysuria, urgency, frequency, hematuria, flank pain and difficulty urinating.  Musculoskeletal: denies  myalgias, back pain, joint swelling, arthralgias and gait problem.   Skin: denies pallor, rash and wound.  Neurological: denies dizziness, seizures, syncope, weakness, light-headedness, numbness and headaches.   Hematological: denies adenopathy, easy bruising, personal or family bleeding history.  Psychiatric/ Behavioral: denies suicidal ideation, mood changes, confusion, nervousness, sleep disturbance and agitation.       All other systems are reviewed and negative.    PHYSICAL EXAM: VS:  BP 124/68 mmHg  Pulse 58  Ht 6' (1.829 m)  Wt 151  lb (68.493 kg)  BMI 20.47 kg/m2  SpO2 98% , BMI Body mass index is 20.47 kg/(m^2). GEN: Well nourished, well developed, in no acute distress HEENT: normal Neck: no JVD, carotid bruits, or masses Cardiac: RRR; no murmurs, rubs, or gallops,no edema  Respiratory:  clear to auscultation bilaterally, normal work of breathing GI: soft, nontender, nondistended, + BS MS: no deformity or atrophy Skin: warm and dry, no rash Neuro:  Strength and sensation are  intact Psych: normal   EKG:  EKG is not ordered today.    Recent Labs: 09/29/2015: ALT 18; Magnesium 1.8; TSH 2.241 09/30/2015: BUN 21*; Creatinine, Ser 1.22; Hemoglobin 12.5*; Platelets 205; Potassium 3.8; Sodium 138    Lipid Panel    Component Value Date/Time   CHOL  09/21/2010 0327    151        ATP III CLASSIFICATION:  <200     mg/dL   Desirable  200-239  mg/dL   Borderline High  >=240    mg/dL   High          TRIG 35 09/21/2010 0327   HDL 44 09/21/2010 0327   CHOLHDL 3.4 09/21/2010 0327   VLDL 7 09/21/2010 0327   LDLCALC * 09/21/2010 0327    100        Total Cholesterol/HDL:CHD Risk Coronary Heart Disease Risk Table                     Men   Women  1/2 Average Risk   3.4   3.3  Average Risk       5.0   4.4  2 X Average Risk   9.6   7.1  3 X Average Risk  23.4   11.0        Use the calculated Patient Ratio above and the CHD Risk Table to determine the patient's CHD Risk.        ATP III CLASSIFICATION (LDL):  <100     mg/dL   Optimal  100-129  mg/dL   Near or Above                    Optimal  130-159  mg/dL   Borderline  160-189  mg/dL   High  >190     mg/dL   Very High      Wt Readings from Last 3 Encounters:  10/15/15 151 lb (68.493 kg)  09/29/15 171 lb 8.3 oz (77.8 kg)  08/04/14 170 lb (77.111 kg)      Other studies Reviewed: Additional studies/ records that were reviewed today include: . Review of the above records demonstrates:    ASSESSMENT AND PLAN:  1.  Paroxysmal atrial fib :    Has remained in NSR  No additional medications.   2. Carotid artery disease Right ICA abnormal waveforms are consistent with a 60-79%  stenosis based Peak Systolic Velocities.  Left ICA abnormal waveforms are consistent with a 40-59% stenosis  based Peak Systolioc Velocities  3.  Failure to thrive:   This is actually his biggest problem in my opinion.   Encouraged him to eat more  Will watch for now.   No indication for CEA or stenting and Im hesitant to  refer him to [facility] at this stage.    Current medicines are reviewed at length with the patient today.  The patient has concerns regarding medicines.  The following changes have been made:  no change  Labs/ tests ordered today include:  No  orders of the defined types were placed in this encounter.     Disposition:   FU with me in 6 months       Nahser, Wonda Cheng, MD  10/15/2015 4:17 PM    [facility] [place], [place], [place]  [address] Phone: [phone]; Fax: [phone]   [facility]  [address] [place], [place]  [address] [phone]   Fax [phone]

## 2015-10-15 NOTE — Patient Instructions (Signed)

## 2015-11-10 IMAGING — RF DG ESOPHAGUS
16 of 20 series · 19 of 24 positions shown · non-contrast
Comparison: None.

FLUOROSCOPY TIME:  2 min and 0 seconds

CLINICAL DATA: Dysphagia.

EXAM:
ESOPHOGRAM / BARIUM SWALLOW / BARIUM TABLET STUDY
TECHNIQUE: Combined double contrast and single contrast examination performed
using effervescent crystals, thick barium liquid, and thin barium
liquid. The patient was observed with fluoroscopy swallowing a 13mm
barium sulphate tablet.

[Series 1: run · 2 of 20 slices shown (1 of 16)]
[im 1/20]
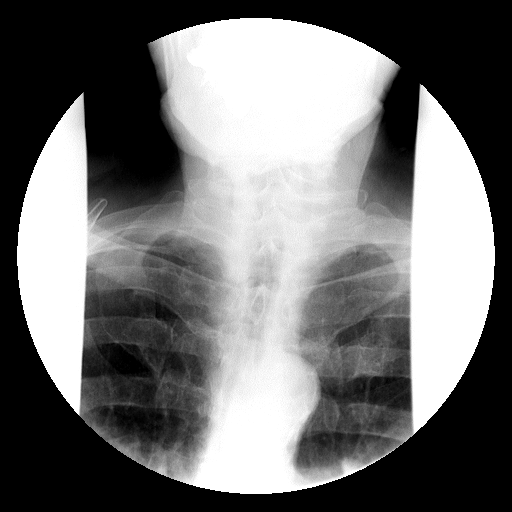
[im 10/20]
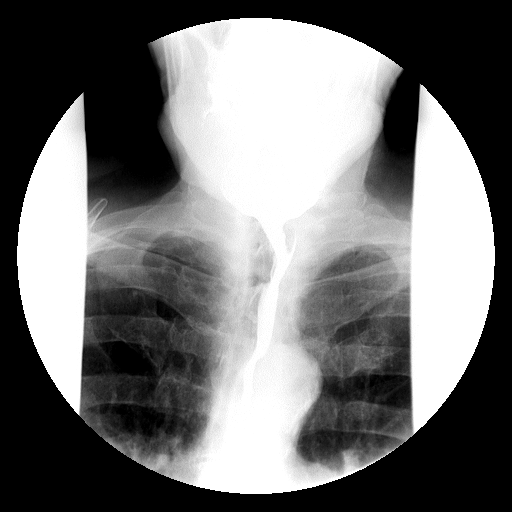

[Series 2: run · 1 of 6 slices shown (2 of 16)]
[im 1/6]
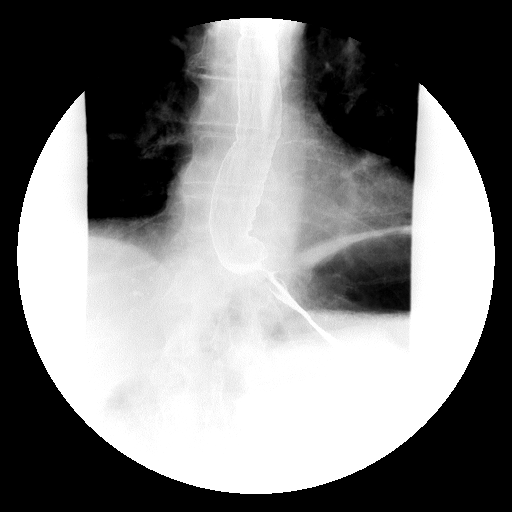

[Series 3: run · 3 of 20 slices shown (3 of 16)]
[im 1/20]
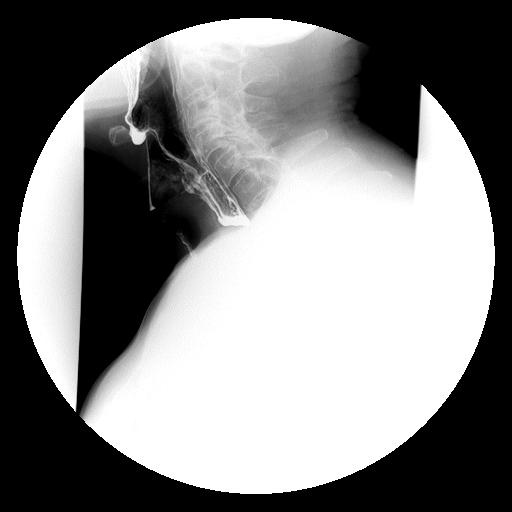
[im 10/20]
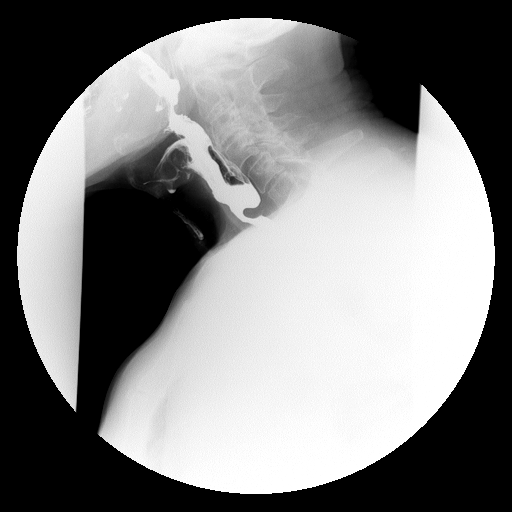
[im 20/20]
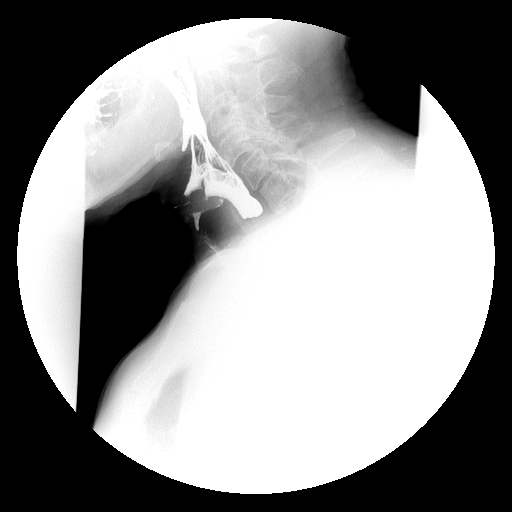

[Series 5: run · 1 of 1 slices shown (4 of 16)]
[im 1/1]
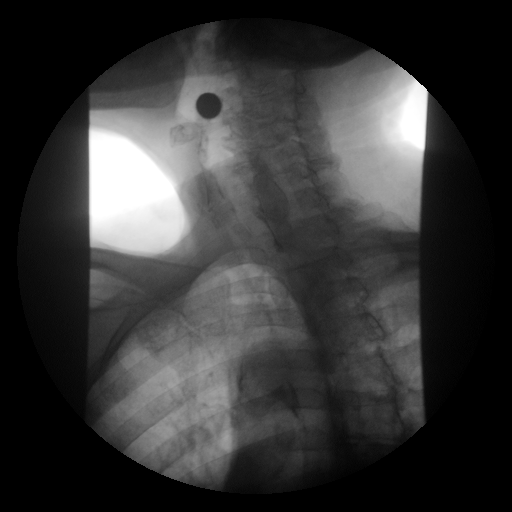

[Series 6: run · 1 of 1 slices shown (5 of 16)]
[im 1/1]
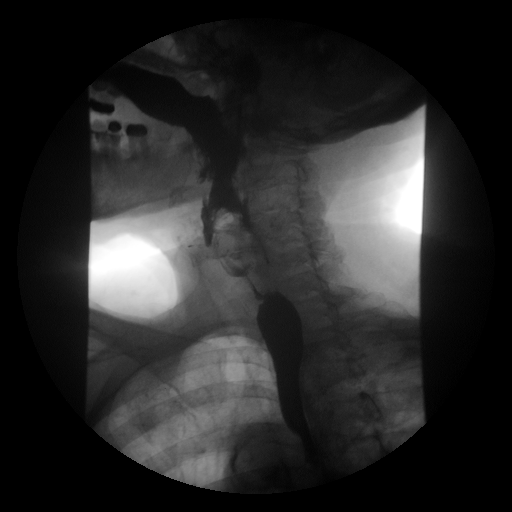

[Series 7: run · 1 of 1 slices shown (6 of 16)]
[im 1/1]
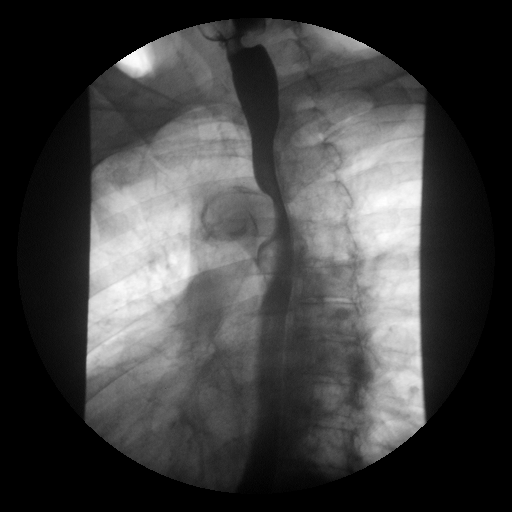

[Series 9: run · 1 of 1 slices shown (7 of 16)]
[im 1/1]
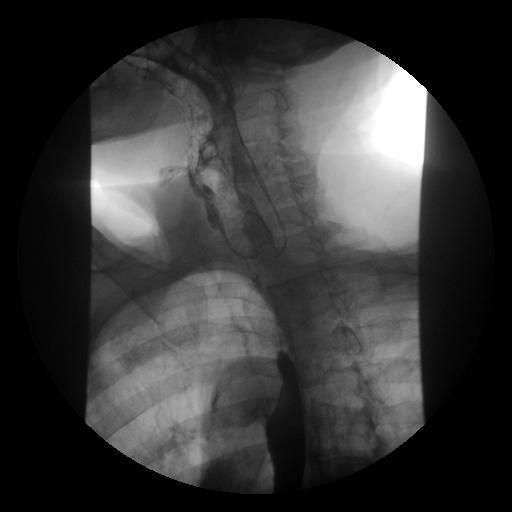

[Series 10: run · 1 of 1 slices shown (8 of 16)]
[im 1/1]
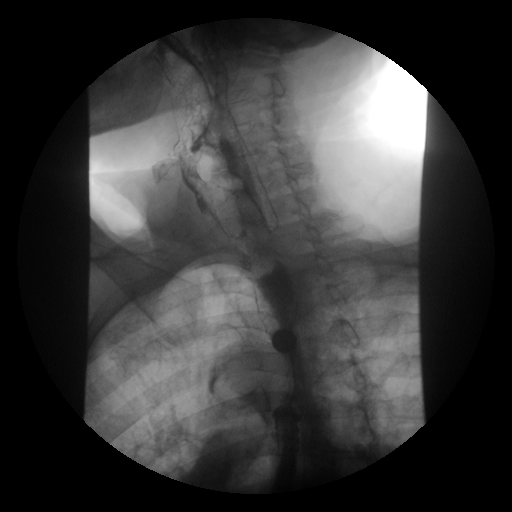

[Series 11: run · 1 of 1 slices shown (9 of 16)]
[im 1/1]
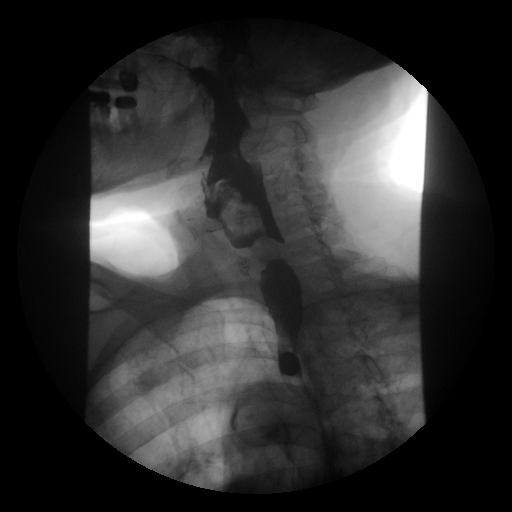

[Series 12: run · 1 of 1 slices shown (10 of 16)]
[im 1/1]
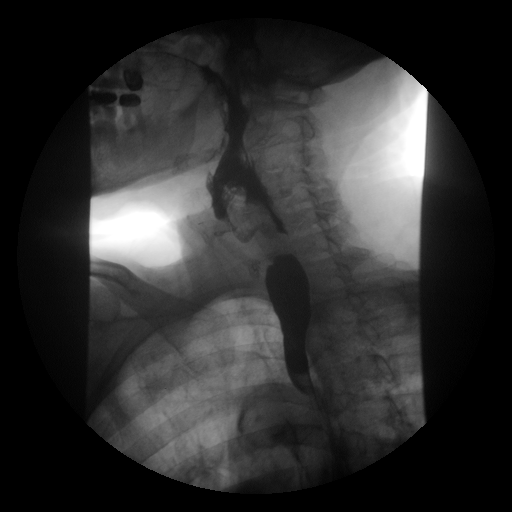

[Series 14: run · 1 of 3 slices shown (11 of 16)]
[im 1/3]
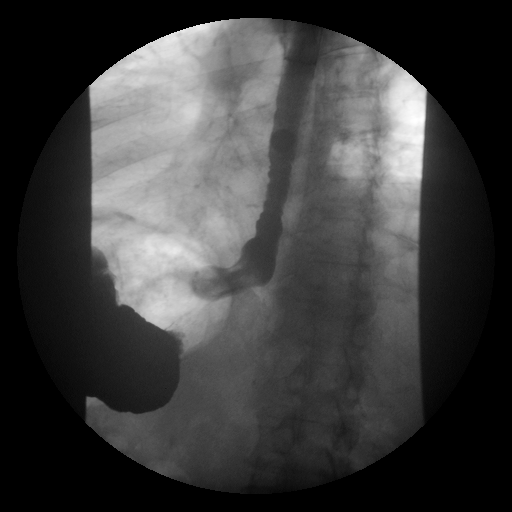

[Series 15: run · 1 of 9 slices shown (12 of 16)]
[im 1/9]
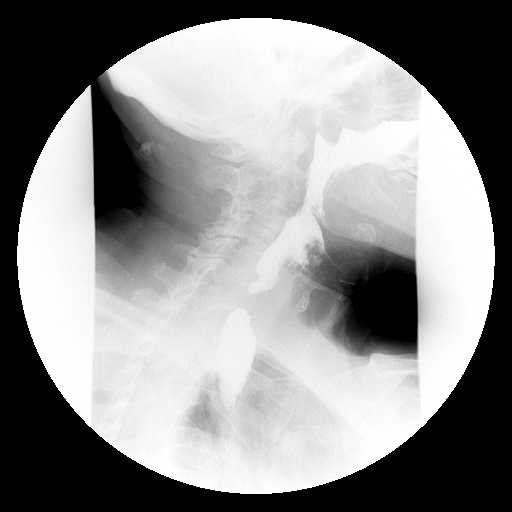

[Series 16: run · 1 of 4 slices shown (13 of 16)]
[im 1/4]
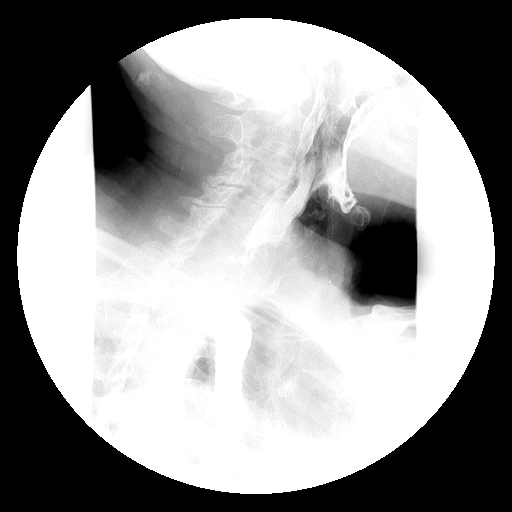

[Series 17: run · 1 of 1 slices shown (14 of 16)]
[im 1/1]
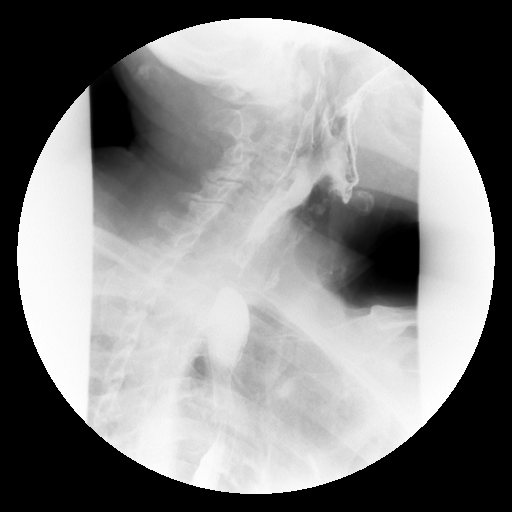

[Series 19: run · 1 of 2 slices shown (15 of 16)]
[im 1/2]
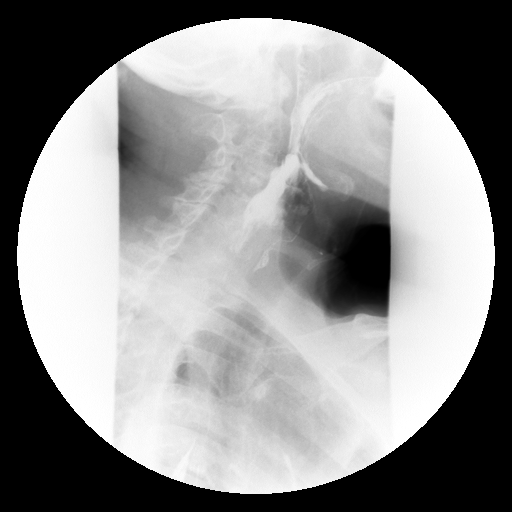

[Series 20: run · 1 of 11 slices shown (16 of 16)]
[im 1/11]
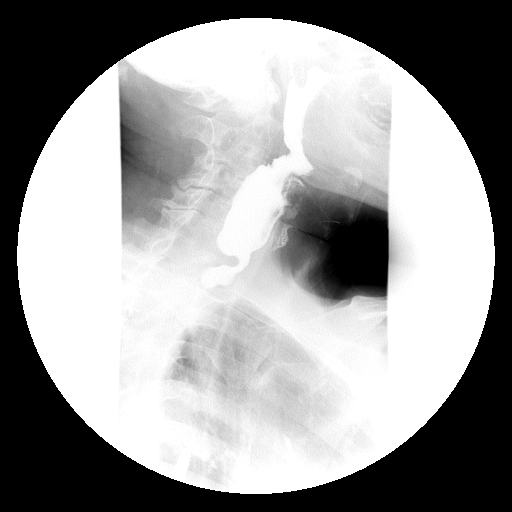

[19 of 24 positions shown; findings below may reference images not displayed]

FINDINGS: The patient has a very prominent cricopharyngeal bar which markedly
narrows the cervical esophagus at the C5-6 level. This bar persists
and never relaxes. The patient has frank aspiration with thin and
thick barium with laryngeal penetration extending below the vocal
cords. The patient ingested a 13 mm barium tablet which lodged above
the bar are but did eventually pass through that area without being
dissolved. The distal esophagus appears normal except for numerous
tertiary contractions. There is a small sliding hiatal hernia.
IMPRESSION: 1. Prominent persistent cricopharyngeal bar markedly narrowing the
cervical esophagus at the C5-6 level.
2. Recurrent aspiration.

## 2015-12-16 DIAGNOSIS — H353133 Nonexudative age-related macular degeneration, bilateral, advanced atrophic without subfoveal involvement: Secondary | ICD-10-CM | POA: Diagnosis not present

## 2015-12-16 DIAGNOSIS — H43812 Vitreous degeneration, left eye: Secondary | ICD-10-CM | POA: Diagnosis not present

## 2015-12-16 DIAGNOSIS — H35361 Drusen (degenerative) of macula, right eye: Secondary | ICD-10-CM | POA: Diagnosis not present

## 2015-12-16 DIAGNOSIS — H43811 Vitreous degeneration, right eye: Secondary | ICD-10-CM | POA: Diagnosis not present

## 2015-12-25 DIAGNOSIS — I129 Hypertensive chronic kidney disease with stage 1 through stage 4 chronic kidney disease, or unspecified chronic kidney disease: Secondary | ICD-10-CM | POA: Diagnosis not present

## 2015-12-25 DIAGNOSIS — R1310 Dysphagia, unspecified: Secondary | ICD-10-CM | POA: Diagnosis not present

## 2015-12-25 DIAGNOSIS — I6529 Occlusion and stenosis of unspecified carotid artery: Secondary | ICD-10-CM | POA: Diagnosis not present

## 2015-12-25 DIAGNOSIS — F0390 Unspecified dementia without behavioral disturbance: Secondary | ICD-10-CM | POA: Diagnosis not present

## 2015-12-25 DIAGNOSIS — E43 Unspecified severe protein-calorie malnutrition: Secondary | ICD-10-CM | POA: Diagnosis not present

## 2015-12-25 DIAGNOSIS — I480 Paroxysmal atrial fibrillation: Secondary | ICD-10-CM | POA: Diagnosis not present

## 2015-12-25 DIAGNOSIS — K222 Esophageal obstruction: Secondary | ICD-10-CM | POA: Diagnosis not present

## 2015-12-25 DIAGNOSIS — K219 Gastro-esophageal reflux disease without esophagitis: Secondary | ICD-10-CM | POA: Diagnosis not present

## 2016-04-14 ENCOUNTER — Ambulatory Visit: Payer: Medicare Other | Admitting: Cardiovascular Disease

## 2016-05-24 ENCOUNTER — Ambulatory Visit (INDEPENDENT_AMBULATORY_CARE_PROVIDER_SITE_OTHER): Payer: Medicare Other | Admitting: Cardiovascular Disease

## 2016-05-24 ENCOUNTER — Encounter: Payer: Self-pay | Admitting: Cardiovascular Disease

## 2016-05-24 VITALS — BP 140/70 | HR 62 | Ht 72.0 in | Wt 144.4 lb

## 2016-05-24 DIAGNOSIS — I739 Peripheral vascular disease, unspecified: Principal | ICD-10-CM

## 2016-05-24 DIAGNOSIS — I10 Essential (primary) hypertension: Secondary | ICD-10-CM | POA: Diagnosis not present

## 2016-05-24 DIAGNOSIS — I480 Paroxysmal atrial fibrillation: Secondary | ICD-10-CM

## 2016-05-24 DIAGNOSIS — I779 Disorder of arteries and arterioles, unspecified: Secondary | ICD-10-CM

## 2016-05-24 NOTE — Progress Notes (Signed)
Cardiology Office Note   Date:  05/24/2016   ID:  William Cisneros, DOB 06-22-1924, MRN [id number]  PCP:  William Hatchet, MD  Cardiologist:   William Moores, MD   Chief Complaint  Patient presents with  . Atrial Fibrillation      Problem list 1. Paroxysmal atrial fibrillation 2. Carotid artery disease 3.   Weight loss /  Failure to thrive 4. . Poor by mouth intake   William Cisneros is a 82 y.o. male who presents for follow up of a recent hospitalization for paroxysmal atrial fib. Thought to be due to volume depletion  Family members have been tag-teaming - trying to have someone with him frequently through the day  His son William Cisneros ) reported that he thinks he as gained some weight since coming home .   Aug. 29, 2017:  William Cisneros is seen for follow up for his PAF.  Seen with son , William Cisneros today .   In assisted living . Is a retired Geologist, engineering.  .     Wt Readings from Last 3 Encounters:  05/24/16 144 lb 6.4 oz (65.5 kg)  10/15/15 151 lb (68.5 kg)  09/29/15 171 lb 8.3 oz (77.8 kg)      Past Medical History:  Diagnosis Date  . COPD (chronic obstructive pulmonary disease) ([place])   . Hyperglycemia    pt states that he has  "hypo" glycemia  . Hypertension   . Hypoglycemia     Past Surgical History:  Procedure Laterality Date  . COLONOSCOPY    . [place]  . VEIN LIGATION AND STRIPPING     rt leg     Current Outpatient Prescriptions  Medication Sig Dispense Refill  . apixaban (ELIQUIS) 5 MG TABS tablet Take 1 tablet (5 mg total) by mouth 2 (two) times daily. 60 tablet 11   No current facility-administered medications for this visit.     Allergies:   Review of patient's allergies indicates no known allergies.    Social History:  The patient  reports that he quit smoking about 49 years ago. He has never used smokeless tobacco. He reports that he drinks alcohol. He reports that he does not use drugs.   Family History:  The  patient's family history includes Alcoholism in his father.    ROS:  Please see the history of present illness.    Review of Systems: Constitutional:  denies fever, chills, diaphoresis, appetite change and fatigue.  HEENT: denies photophobia, eye pain, redness, hearing loss, ear pain, congestion, sore throat, rhinorrhea, sneezing, neck pain, neck stiffness and tinnitus.  Respiratory: denies SOB, DOE, cough, chest tightness, and wheezing.  Cardiovascular: denies chest pain, palpitations and leg swelling.  Gastrointestinal: denies nausea, vomiting, abdominal pain, diarrhea, constipation, blood in stool.  Genitourinary: denies dysuria, urgency, frequency, hematuria, flank pain and difficulty urinating.  Musculoskeletal: denies  myalgias, back pain, joint swelling, arthralgias and gait problem.   Skin: denies pallor, rash and wound.  Neurological: denies dizziness, seizures, syncope, weakness, light-headedness, numbness and headaches.   Hematological: denies adenopathy, easy bruising, personal or family bleeding history.  Psychiatric/ Behavioral: denies suicidal ideation, mood changes, confusion, nervousness, sleep disturbance and agitation.       All other systems are reviewed and negative.    PHYSICAL EXAM: VS:  BP 140/70   Pulse 62   Ht 6' (1.829 m)   Wt 144 lb 6.4 oz (65.5 kg)   BMI 19.58 kg/m  , BMI Body mass  index is 19.58 kg/m. GEN: Well nourished, well developed, in no acute distress  HEENT: normal  Neck: no JVD, carotid bruits, or masses Cardiac: RRR; no murmurs, rubs, or gallops,no edema  Respiratory:  clear to auscultation bilaterally, normal work of breathing GI: soft, nontender, nondistended, + BS MS: no deformity or atrophy  Skin: warm and dry, no rash Neuro:  Strength and sensation are intact Psych: normal   EKG:  EKG is not ordered today.    Recent Labs: 09/29/2015: ALT 18; Magnesium 1.8; TSH 2.241 09/30/2015: BUN 21; Creatinine, Ser 1.22; Hemoglobin 12.5;  Platelets 205; Potassium 3.8; Sodium 138    Lipid Panel    Component Value Date/Time   CHOL  09/21/2010 0327    151        ATP III CLASSIFICATION:  <200     mg/dL   Desirable  200-239  mg/dL   Borderline High  >=240    mg/dL   High          TRIG 35 09/21/2010 0327   HDL 44 09/21/2010 0327   CHOLHDL 3.4 09/21/2010 0327   VLDL 7 09/21/2010 0327   LDLCALC (H) 09/21/2010 0327    100        Total Cholesterol/HDL:CHD Risk Coronary Heart Disease Risk Table                     Men   Women  1/2 Average Risk   3.4   3.3  Average Risk       5.0   4.4  2 X Average Risk   9.6   7.1  3 X Average Risk  23.4   11.0        Use the calculated Patient Ratio above and the CHD Risk Table to determine the patient's CHD Risk.        ATP III CLASSIFICATION (LDL):  <100     mg/dL   Optimal  100-129  mg/dL   Near or Above                    Optimal  130-159  mg/dL   Borderline  160-189  mg/dL   High  >190     mg/dL   Very High      Wt Readings from Last 3 Encounters:  05/24/16 144 lb 6.4 oz (65.5 kg)  10/15/15 151 lb (68.5 kg)  09/29/15 171 lb 8.3 oz (77.8 kg)      Other studies Reviewed: Additional studies/ records that were reviewed today include: . Review of the above records demonstrates:    ASSESSMENT AND PLAN:  1.  Paroxysmal atrial fib :    Has remained in NSR  No additional medications. Stable Will see in 9 months   2. Carotid artery disease Right ICA abnormal waveforms are consistent with a 60-79%  stenosis based Peak Systolic Velocities.  Left ICA abnormal waveforms are consistent with a 40-59% stenosis  based Peak Systolioc Velocities Will recheck carotid duplex 1-2 weeks prior to his next visit in 9 months   3.  Failure to thrive:   This is actually his biggest problem in my opinion.   Wt Readings from Last 3 Encounters:  05/24/16 144 lb 6.4 oz (65.5 kg)  10/15/15 151 lb (68.5 kg)  09/29/15 171 lb 8.3 oz (77.8 kg)   Encouraged him to eat more Has been  stable for the past several months   Will watch for now.   No indication for CEA  or stenting and Im hesitant to refer him to [facility] at this stage.    Current medicines are reviewed at length with the patient today.  The patient has concerns regarding medicines.  The following changes have been made:  no change  Labs/ tests ordered today include:  No orders of the defined types were placed in this encounter.    Disposition:   FU with me in 9 months       William Moores, MD  05/24/2016 8:31 AM    [address] [place], [place], [place]  [address] Phone: [phone]; Fax: [phone]

## 2016-05-24 NOTE — Patient Instructions (Signed)
Medication Instructions:  Your physician recommends that you continue on your current medications as directed. Please refer to the Current Medication list given to you today.   Labwork: None Ordered   Testing/Procedures: Your physician has requested that you have a carotid duplex in 9 months before your next appointment with Dr. Acie Fredrickson. This test is an ultrasound of the carotid arteries in your neck. It looks at blood flow through these arteries that supply the brain with blood. Allow one hour for this exam. There are no restrictions or special instructions.   Follow-Up: Your physician wants you to follow-up in: 9 months with Dr. Acie Fredrickson.  You will receive a reminder letter in the mail two months in advance. If you don't receive a letter, please call our office to schedule the follow-up appointment.   If you need a refill on your cardiac medications before your next appointment, please call your pharmacy.   Thank you for choosing [facility]! Christen Bame, RN [phone]

## 2016-06-09 DIAGNOSIS — Z23 Encounter for immunization: Secondary | ICD-10-CM | POA: Diagnosis not present

## 2016-08-27 ENCOUNTER — Emergency Department (HOSPITAL_COMMUNITY): Payer: Medicare Other

## 2016-08-27 ENCOUNTER — Emergency Department (HOSPITAL_COMMUNITY)
Admission: EM | Admit: 2016-08-27 | Discharge: 2016-08-27 | Disposition: A | Discharge status: Home / Self Care | Payer: Medicare Other | Attending: Emergency Medicine | Admitting: Emergency Medicine

## 2016-08-27 ENCOUNTER — Encounter (HOSPITAL_COMMUNITY): Payer: Self-pay

## 2016-08-27 DIAGNOSIS — Z7901 Long term (current) use of anticoagulants: Secondary | ICD-10-CM | POA: Insufficient documentation

## 2016-08-27 DIAGNOSIS — Z87891 Personal history of nicotine dependence: Secondary | ICD-10-CM | POA: Insufficient documentation

## 2016-08-27 DIAGNOSIS — Z7902 Long term (current) use of antithrombotics/antiplatelets: Secondary | ICD-10-CM | POA: Diagnosis not present

## 2016-08-27 DIAGNOSIS — S022XXA Fracture of nasal bones, initial encounter for closed fracture: Secondary | ICD-10-CM

## 2016-08-27 DIAGNOSIS — W19XXXA Unspecified fall, initial encounter: Secondary | ICD-10-CM

## 2016-08-27 DIAGNOSIS — Y999 Unspecified external cause status: Secondary | ICD-10-CM | POA: Insufficient documentation

## 2016-08-27 DIAGNOSIS — W1839XA Other fall on same level, initial encounter: Secondary | ICD-10-CM | POA: Insufficient documentation

## 2016-08-27 DIAGNOSIS — S0990XA Unspecified injury of head, initial encounter: Secondary | ICD-10-CM | POA: Diagnosis not present

## 2016-08-27 DIAGNOSIS — S0992XA Unspecified injury of nose, initial encounter: Secondary | ICD-10-CM | POA: Diagnosis present

## 2016-08-27 DIAGNOSIS — S0081XA Abrasion of other part of head, initial encounter: Secondary | ICD-10-CM | POA: Diagnosis not present

## 2016-08-27 DIAGNOSIS — Y939 Activity, unspecified: Secondary | ICD-10-CM | POA: Insufficient documentation

## 2016-08-27 DIAGNOSIS — J449 Chronic obstructive pulmonary disease, unspecified: Secondary | ICD-10-CM | POA: Insufficient documentation

## 2016-08-27 DIAGNOSIS — S199XXA Unspecified injury of neck, initial encounter: Secondary | ICD-10-CM | POA: Diagnosis not present

## 2016-08-27 DIAGNOSIS — I10 Essential (primary) hypertension: Secondary | ICD-10-CM | POA: Insufficient documentation

## 2016-08-27 DIAGNOSIS — S0083XA Contusion of other part of head, initial encounter: Secondary | ICD-10-CM

## 2016-08-27 DIAGNOSIS — T1490XA Injury, unspecified, initial encounter: Secondary | ICD-10-CM | POA: Diagnosis not present

## 2016-08-27 DIAGNOSIS — Z79899 Other long term (current) drug therapy: Secondary | ICD-10-CM | POA: Insufficient documentation

## 2016-08-27 DIAGNOSIS — S0993XA Unspecified injury of face, initial encounter: Secondary | ICD-10-CM | POA: Diagnosis not present

## 2016-08-27 DIAGNOSIS — Z23 Encounter for immunization: Secondary | ICD-10-CM | POA: Diagnosis not present

## 2016-08-27 DIAGNOSIS — Y929 Unspecified place or not applicable: Secondary | ICD-10-CM | POA: Diagnosis not present

## 2016-08-27 MED ORDER — SALINE SPRAY 0.65 % NA SOLN: 1.0000 | NASAL | 0 refills | Status: AC | PRN

## 2016-08-27 MED ORDER — TETANUS-DIPHTH-ACELL PERTUSSIS 5-2.5-18.5 LF-MCG/0.5 IM SUSP
0.5000 mL | Freq: Once | INTRAMUSCULAR | Status: AC
  Administered 2016-08-27: 0.5 mL via INTRAMUSCULAR
  Filled 2016-08-27: qty 0.5

## 2016-08-27 MED ORDER — BACITRACIN ZINC 500 UNIT/GM EX OINT
1.0000 "application " | TOPICAL_OINTMENT | Freq: Once | CUTANEOUS | Status: AC
  Administered 2016-08-27: 1 via TOPICAL
  Filled 2016-08-27: qty 0.9

## 2016-08-27 NOTE — ED Triage Notes (Addendum)
Patient bib [facility], patient from assisted living with c/o ground level fall.  Patient denies LOC.  Per EMS patient has abrasion on right wrist and to Forehead. Patient is on blood thinners.

## 2016-08-27 NOTE — Discharge Instructions (Signed)
Please follow with your primary care doctor in the next 2 days for a check-up. They must obtain records for further management.  ° °Do not hesitate to return to the Emergency Department for any new, worsening or concerning symptoms.  ° °

## 2016-08-27 NOTE — ED Notes (Signed)
Patient A&O x4.  Patient denies pain.  Patient denies LOC but, states that hit his head on hard floor.  States that the area that he was stepping up to was the same color as the area he was going from and he just tripped.  Patient has abrasions to right side of forehead, right wrist, and dry blood in the right nare.

## 2016-08-27 NOTE — ED Notes (Signed)
Patient transported to CT 

## 2016-08-27 NOTE — ED Notes (Signed)
Bed: [facility] Expected date:  Expected time:  Means of arrival:  Comments: 

## 2016-08-27 NOTE — ED Provider Notes (Signed)
[facility] DEPT Provider Note   CSN: [id number] Arrival date & time: 08/27/16  1515     History   Chief Complaint Chief Complaint  Patient presents with  . Fall   HPI   Blood pressure 158/71, pulse 76, temperature 97.7 F (36.5 C), temperature source Oral, resp. rate 17, SpO2 93 %.  William Cisneros is a 82 y.o. male brought in by EMS, patient resides in independent living, he walks frequently was taking a walk this afternoon and he misjudged the depth of the concrete and had a mechanical fall forward onto concrete. Patient is anticoagulated with Eliquis for atrial fibrillation. He takes his medication regularly. There was no prodrome of chest pain, lightheadedness or nausea vomiting before the fall to suggest a syncope. He did not lose consciousness, he got up immediately and walked about 100 yards home. His last tetanus shot is unknown. He denies significant headache, change in vision, nausea, vomiting, cervicalgia, chest pain, abdominal pain or difficulty moving major joints. He reports that he had a nosebleed which resolved fairly quickly after the fall and he has a skin tear on his right wrist with no significant wrist pain.  Past Medical History:  Diagnosis Date  . COPD (chronic obstructive pulmonary disease) ([place])   . Hyperglycemia    pt states that he has  "hypo" glycemia  . Hypertension   . Hypoglycemia     Patient Active Problem List   Diagnosis Date Noted  . Failure to thrive in adult 09/29/2015  . Paroxysmal atrial fibrillation ([place]) 09/29/2015  . Weakness 09/29/2015  . Atrial fibrillation ([place]) 09/29/2015  . Protein-calorie malnutrition, severe 09/29/2015  . Epistaxis 09/07/2013  . Chest pain 03/06/2012  . Dysphagia, unspecified(787.20) 03/05/2012  . Hypertension   . Hyperglycemia     Past Surgical History:  Procedure Laterality Date  . COLONOSCOPY    . [place]  . VEIN LIGATION AND STRIPPING     rt leg       Home Medications     Prior to Admission medications   Medication Sig Start Date End Date Taking? Authorizing Provider  apixaban (ELIQUIS) 5 MG TABS tablet Take 1 tablet (5 mg total) by mouth 2 (two) times daily. 10/15/15  Yes Thayer Headings, MD  SERTRALINE HCL PO Take by mouth.   Yes Historical Provider, MD  sodium chloride (OCEAN) 0.65 % SOLN nasal spray Place 1 spray into both nostrils as needed for congestion. 08/27/16   Elmyra Ricks Latayvia Mandujano, PA-C    Family History Family History  Problem Relation Age of Onset  . Alcoholism Father   . Colon cancer Neg Hx   . Esophageal cancer Neg Hx   . Stomach cancer Neg Hx     Social History Social History  Substance Use Topics  . Smoking status: Former Smoker    Quit date: 09/26/1966  . Smokeless tobacco: Never Used  . Alcohol use Yes     Comment: occassional     Allergies   Patient has no known allergies.   Review of Systems Review of Systems  10 systems reviewed and found to be negative, except as noted in the HPI.  Physical Exam Updated Vital Signs BP 147/66 (BP Location: Left Arm)   Pulse 72   Temp 97.5 F (36.4 C) (Oral)   Resp 18   SpO2 99%   Physical Exam  Constitutional: He is oriented to person, place, and time. He appears well-developed and well-nourished. No distress.  HENT:  Head: Normocephalic.  Mouth/Throat:  Oropharynx is clear and moist.  Partial thickness abrasion and contusion to left forehead, no tenderness palpation or crepitance along the orbital rim, extraocular movement is intact without pain or diplopia. Mild tenderness to palpation along the nasal bridge, dried blood in the right nares with no gross septal deviation or septal hematoma. No intraoral trauma or loose teeth, or malocclusion.  Eyes: Conjunctivae and EOM are normal. Pupils are equal, round, and reactive to light.  Neck: Normal range of motion.  No midline C-spine  tenderness to palpation or step-offs appreciated. Patient has full range of motion without  pain.  Grip strength, biceps, triceps 5/5 bilaterally;  can differentiate between pinprick and light touch bilaterally.   Cardiovascular: Normal rate, regular rhythm and intact distal pulses.   Pulmonary/Chest: Effort normal and breath sounds normal. No respiratory distress. He has no wheezes. He has no rales. He exhibits no tenderness.  Abdominal: Soft. He exhibits no distension and no mass. There is no tenderness. There is no rebound and no guarding. No hernia.  Musculoskeletal: Normal range of motion. He exhibits tenderness.  5 cm skin tear along the radial aspect of the right wrist with no bony tenderness, full range of motion to wrist and fingers. No snuffbox tenderness bilaterally  Neurological: He is alert and oriented to person, place, and time.  Skin: Capillary refill takes less than 2 seconds. He is not diaphoretic.  Psychiatric: He has a normal mood and affect.  Nursing note and vitals reviewed.    ED Treatments / Results  Labs (all labs ordered are listed, but only abnormal results are displayed) Labs Reviewed - No data to display  EKG  EKG Interpretation None       Radiology Ct Head Wo Contrast  Result Date: 08/27/2016 CLINICAL DATA:  Pt. states that hit his head on hard floor. States that the area that he was stepping up to was the same color as the area he was going from and he just tripped. Patient has abrasions to right side of forehead, right wrist, and dry blood in the right nare. Pt. C/o rt. Forehead pain/hematoma, pt. Denies any head/neck complaints EXAM: CT HEAD WITHOUT CONTRAST CT MAXILLOFACIAL WITHOUT CONTRAST CT CERVICAL SPINE WITHOUT CONTRAST TECHNIQUE: Multidetector CT imaging of the head, cervical spine, and maxillofacial structures were performed using the standard protocol without intravenous contrast. Multiplanar CT image reconstructions of the cervical spine and maxillofacial structures were also generated. COMPARISON:  09/29/2015 FINDINGS: CT HEAD  FINDINGS Brain: No evidence of acute infarction, hemorrhage, hydrocephalus, extra-axial collection or mass lesion/mass effect. The ventricles are normal in configuration. There is ventricular and sulcal enlargement reflecting moderate generalized atrophy. Patchy white matter hypoattenuation is noted consistent with moderate chronic microvascular ischemic change. Vascular: No hyperdense vessel or unexpected calcification. Skull: No skull fracture. Other: Right frontal scalp hematoma. CT MAXILLOFACIAL FINDINGS Osseous: Nondisplaced, nondepressed fracture of the inferior right nasal bone with associated soft tissue swelling. There are no other fractures. No bone lesions. Orbits: Status post bilateral cataract surgery. Globes and orbits are otherwise unremarkable. Sinuses: Clear. Soft tissues: Right forehead hematoma. No soft tissue masses or adenopathy. Dense carotid artery calcifications. CT CERVICAL SPINE FINDINGS Alignment: Grade 1 anterolisthesis of C7 on T1, degenerative in origin. No other spondylolisthesis. Skull base and vertebrae: No acute fracture. No primary bone lesion or focal pathologic process. Soft tissues and spinal canal: No prevertebral fluid or swelling. No visible canal hematoma. Disc levels: There are disc and facet degenerative changes throughout the cervical spine. No evidence of  a disc herniation. Upper chest: There is apical lung scarring and mild emphysema. Other: Dense carotid artery vascular calcifications are noted. IMPRESSION: HEAD CT: No acute intracranial abnormalities. Right frontal scalp hematoma. No skull fracture. MAXILLOFACIAL CT: Nondisplaced right nasal fracture. No other fractures. No other acute abnormality. CERVICAL CT:  No fracture or acute finding. Electronically Signed   By: Lajean Manes M.D.   On: 08/27/2016 16:42   Ct Cervical Spine Wo Contrast  Result Date: 08/27/2016 CLINICAL DATA:  Pt. states that hit his head on hard floor. States that the area that he was  stepping up to was the same color as the area he was going from and he just tripped. Patient has abrasions to right side of forehead, right wrist, and dry blood in the right nare. Pt. C/o rt. Forehead pain/hematoma, pt. Denies any head/neck complaints EXAM: CT HEAD WITHOUT CONTRAST CT MAXILLOFACIAL WITHOUT CONTRAST CT CERVICAL SPINE WITHOUT CONTRAST TECHNIQUE: Multidetector CT imaging of the head, cervical spine, and maxillofacial structures were performed using the standard protocol without intravenous contrast. Multiplanar CT image reconstructions of the cervical spine and maxillofacial structures were also generated. COMPARISON:  09/29/2015 FINDINGS: CT HEAD FINDINGS Brain: No evidence of acute infarction, hemorrhage, hydrocephalus, extra-axial collection or mass lesion/mass effect. The ventricles are normal in configuration. There is ventricular and sulcal enlargement reflecting moderate generalized atrophy. Patchy white matter hypoattenuation is noted consistent with moderate chronic microvascular ischemic change. Vascular: No hyperdense vessel or unexpected calcification. Skull: No skull fracture. Other: Right frontal scalp hematoma. CT MAXILLOFACIAL FINDINGS Osseous: Nondisplaced, nondepressed fracture of the inferior right nasal bone with associated soft tissue swelling. There are no other fractures. No bone lesions. Orbits: Status post bilateral cataract surgery. Globes and orbits are otherwise unremarkable. Sinuses: Clear. Soft tissues: Right forehead hematoma. No soft tissue masses or adenopathy. Dense carotid artery calcifications. CT CERVICAL SPINE FINDINGS Alignment: Grade 1 anterolisthesis of C7 on T1, degenerative in origin. No other spondylolisthesis. Skull base and vertebrae: No acute fracture. No primary bone lesion or focal pathologic process. Soft tissues and spinal canal: No prevertebral fluid or swelling. No visible canal hematoma. Disc levels: There are disc and facet degenerative changes  throughout the cervical spine. No evidence of a disc herniation. Upper chest: There is apical lung scarring and mild emphysema. Other: Dense carotid artery vascular calcifications are noted. IMPRESSION: HEAD CT: No acute intracranial abnormalities. Right frontal scalp hematoma. No skull fracture. MAXILLOFACIAL CT: Nondisplaced right nasal fracture. No other fractures. No other acute abnormality. CERVICAL CT:  No fracture or acute finding. Electronically Signed   By: Lajean Manes M.D.   On: 08/27/2016 16:42   Ct Maxillofacial Wo Contrast  Result Date: 08/27/2016 CLINICAL DATA:  Pt. states that hit his head on hard floor. States that the area that he was stepping up to was the same color as the area he was going from and he just tripped. Patient has abrasions to right side of forehead, right wrist, and dry blood in the right nare. Pt. C/o rt. Forehead pain/hematoma, pt. Denies any head/neck complaints EXAM: CT HEAD WITHOUT CONTRAST CT MAXILLOFACIAL WITHOUT CONTRAST CT CERVICAL SPINE WITHOUT CONTRAST TECHNIQUE: Multidetector CT imaging of the head, cervical spine, and maxillofacial structures were performed using the standard protocol without intravenous contrast. Multiplanar CT image reconstructions of the cervical spine and maxillofacial structures were also generated. COMPARISON:  09/29/2015 FINDINGS: CT HEAD FINDINGS Brain: No evidence of acute infarction, hemorrhage, hydrocephalus, extra-axial collection or mass lesion/mass effect. The ventricles are normal in configuration.  There is ventricular and sulcal enlargement reflecting moderate generalized atrophy. Patchy white matter hypoattenuation is noted consistent with moderate chronic microvascular ischemic change. Vascular: No hyperdense vessel or unexpected calcification. Skull: No skull fracture. Other: Right frontal scalp hematoma. CT MAXILLOFACIAL FINDINGS Osseous: Nondisplaced, nondepressed fracture of the inferior right nasal bone with associated soft  tissue swelling. There are no other fractures. No bone lesions. Orbits: Status post bilateral cataract surgery. Globes and orbits are otherwise unremarkable. Sinuses: Clear. Soft tissues: Right forehead hematoma. No soft tissue masses or adenopathy. Dense carotid artery calcifications. CT CERVICAL SPINE FINDINGS Alignment: Grade 1 anterolisthesis of C7 on T1, degenerative in origin. No other spondylolisthesis. Skull base and vertebrae: No acute fracture. No primary bone lesion or focal pathologic process. Soft tissues and spinal canal: No prevertebral fluid or swelling. No visible canal hematoma. Disc levels: There are disc and facet degenerative changes throughout the cervical spine. No evidence of a disc herniation. Upper chest: There is apical lung scarring and mild emphysema. Other: Dense carotid artery vascular calcifications are noted. IMPRESSION: HEAD CT: No acute intracranial abnormalities. Right frontal scalp hematoma. No skull fracture. MAXILLOFACIAL CT: Nondisplaced right nasal fracture. No other fractures. No other acute abnormality. CERVICAL CT:  No fracture or acute finding. Electronically Signed   By: Lajean Manes M.D.   On: 08/27/2016 16:42    Procedures Procedures (including critical care time)  Medications Ordered in ED Medications  Tdap (BOOSTRIX) injection 0.5 mL (0.5 mLs Intramuscular Given 08/27/16 1628)  bacitracin ointment 1 application (1 application Topical Given 08/27/16 1628)     Initial Impression / Assessment and Plan / ED Course  I have reviewed the triage vital signs and the nursing notes.  Pertinent labs & imaging results that were available during my care of the patient were reviewed by me and considered in my medical decision making (see chart for details).  Clinical Course     Vitals:   08/27/16 1523 08/27/16 1555 08/27/16 1744  BP: 158/71  147/66  Pulse: 76  72  Resp: 17  18  Temp:  97.7 F (36.5 C) 97.5 F (36.4 C)  TempSrc:  Oral Oral  SpO2: 93%   99%    Medications  Tdap (BOOSTRIX) injection 0.5 mL (0.5 mLs Intramuscular Given 08/27/16 1628)  bacitracin ointment 1 application (1 application Topical Given 08/27/16 1628)    Zoraiz Negley is 82 y.o. male presenting with A can a fall forward onto concrete, patient is anticoagulated with Eliquis for A. fib. There was no loss of consciousness, neurologic exam is nonfocal. He also has abrasion to the right wrist but no bony tenderness with full range of motion. Patient pending max face, head and C-spine CT. We'll clean and dress wounds and update tetanus. Patient declines pain medication in the ED. We've had a discussion of delayed bleed with Eliquis. Patient and his family verbalized understanding of return precautions.  CT with nasal bone fracture. ENT referral given and saline rinse recommended.  This is a shared visit with the attending physician who personally evaluated the patient and agrees with the care plan.   Evaluation does not show pathology that would require ongoing emergent intervention or inpatient treatment. Pt is hemodynamically stable and mentating appropriately. Discussed findings and plan with patient/guardian, who agrees with care plan. All questions answered. Return precautions discussed and outpatient follow up given.    Final Clinical Impressions(s) / ED Diagnoses   Final diagnoses:  Fall, initial encounter  Contusion of face, initial encounter  Closed fracture  of nasal bone, initial encounter    New Prescriptions Discharge Medication List as of 08/27/2016  5:13 PM    START taking these medications   Details  sodium chloride (OCEAN) 0.65 % SOLN nasal spray Place 1 spray into both nostrils as needed for congestion., Starting Sat 08/27/2016, [employer], PA-C 08/27/16 1813    Carmin Muskrat, MD 08/28/16 2356

## 2016-10-07 DIAGNOSIS — I10 Essential (primary) hypertension: Secondary | ICD-10-CM | POA: Diagnosis not present

## 2016-10-17 DIAGNOSIS — K219 Gastro-esophageal reflux disease without esophagitis: Secondary | ICD-10-CM | POA: Diagnosis not present

## 2016-10-17 DIAGNOSIS — R1310 Dysphagia, unspecified: Secondary | ICD-10-CM | POA: Diagnosis not present

## 2016-10-17 DIAGNOSIS — I480 Paroxysmal atrial fibrillation: Secondary | ICD-10-CM | POA: Diagnosis not present

## 2016-10-17 DIAGNOSIS — I10 Essential (primary) hypertension: Secondary | ICD-10-CM | POA: Diagnosis not present

## 2016-10-17 DIAGNOSIS — F325 Major depressive disorder, single episode, in full remission: Secondary | ICD-10-CM | POA: Diagnosis not present

## 2016-10-17 DIAGNOSIS — F0390 Unspecified dementia without behavioral disturbance: Secondary | ICD-10-CM | POA: Diagnosis not present

## 2016-10-17 DIAGNOSIS — Z1389 Encounter for screening for other disorder: Secondary | ICD-10-CM | POA: Diagnosis not present

## 2016-10-17 DIAGNOSIS — K222 Esophageal obstruction: Secondary | ICD-10-CM | POA: Diagnosis not present

## 2016-10-17 DIAGNOSIS — Z0000 Encounter for general adult medical examination without abnormal findings: Secondary | ICD-10-CM | POA: Diagnosis not present

## 2016-10-17 DIAGNOSIS — I6529 Occlusion and stenosis of unspecified carotid artery: Secondary | ICD-10-CM | POA: Diagnosis not present

## 2016-10-17 DIAGNOSIS — Z6822 Body mass index (BMI) 22.0-22.9, adult: Secondary | ICD-10-CM | POA: Diagnosis not present

## 2016-10-17 DIAGNOSIS — E43 Unspecified severe protein-calorie malnutrition: Secondary | ICD-10-CM | POA: Diagnosis not present

## 2016-11-04 DIAGNOSIS — N390 Urinary tract infection, site not specified: Secondary | ICD-10-CM | POA: Diagnosis not present

## 2016-11-17 DIAGNOSIS — E559 Vitamin D deficiency, unspecified: Secondary | ICD-10-CM | POA: Diagnosis not present

## 2016-11-17 DIAGNOSIS — F325 Major depressive disorder, single episode, in full remission: Secondary | ICD-10-CM | POA: Diagnosis not present

## 2016-11-17 DIAGNOSIS — R451 Restlessness and agitation: Secondary | ICD-10-CM | POA: Diagnosis not present

## 2016-11-17 DIAGNOSIS — F0390 Unspecified dementia without behavioral disturbance: Secondary | ICD-10-CM | POA: Diagnosis not present

## 2016-11-25 DIAGNOSIS — Z9183 Wandering in diseases classified elsewhere: Secondary | ICD-10-CM | POA: Diagnosis not present

## 2016-11-25 DIAGNOSIS — F325 Major depressive disorder, single episode, in full remission: Secondary | ICD-10-CM | POA: Diagnosis not present

## 2016-11-25 DIAGNOSIS — K222 Esophageal obstruction: Secondary | ICD-10-CM | POA: Diagnosis not present

## 2016-11-25 DIAGNOSIS — F0281 Dementia in other diseases classified elsewhere with behavioral disturbance: Secondary | ICD-10-CM | POA: Diagnosis not present

## 2016-11-25 DIAGNOSIS — G309 Alzheimer's disease, unspecified: Secondary | ICD-10-CM | POA: Diagnosis not present

## 2016-11-25 DIAGNOSIS — I10 Essential (primary) hypertension: Secondary | ICD-10-CM | POA: Diagnosis not present

## 2016-11-25 DIAGNOSIS — G308 Other Alzheimer's disease: Secondary | ICD-10-CM | POA: Diagnosis not present

## 2016-11-25 DIAGNOSIS — I4891 Unspecified atrial fibrillation: Secondary | ICD-10-CM | POA: Diagnosis not present

## 2016-11-25 DIAGNOSIS — K219 Gastro-esophageal reflux disease without esophagitis: Secondary | ICD-10-CM | POA: Diagnosis not present

## 2016-11-28 DIAGNOSIS — Z9183 Wandering in diseases classified elsewhere: Secondary | ICD-10-CM | POA: Diagnosis not present

## 2016-11-28 DIAGNOSIS — G309 Alzheimer's disease, unspecified: Secondary | ICD-10-CM | POA: Diagnosis not present

## 2016-11-28 DIAGNOSIS — I10 Essential (primary) hypertension: Secondary | ICD-10-CM | POA: Diagnosis not present

## 2016-11-28 DIAGNOSIS — K222 Esophageal obstruction: Secondary | ICD-10-CM | POA: Diagnosis not present

## 2016-11-28 DIAGNOSIS — K219 Gastro-esophageal reflux disease without esophagitis: Secondary | ICD-10-CM | POA: Diagnosis not present

## 2016-11-28 DIAGNOSIS — F0281 Dementia in other diseases classified elsewhere with behavioral disturbance: Secondary | ICD-10-CM | POA: Diagnosis not present

## 2016-12-01 DIAGNOSIS — Z9183 Wandering in diseases classified elsewhere: Secondary | ICD-10-CM | POA: Diagnosis not present

## 2016-12-01 DIAGNOSIS — K219 Gastro-esophageal reflux disease without esophagitis: Secondary | ICD-10-CM | POA: Diagnosis not present

## 2016-12-01 DIAGNOSIS — F0281 Dementia in other diseases classified elsewhere with behavioral disturbance: Secondary | ICD-10-CM | POA: Diagnosis not present

## 2016-12-01 DIAGNOSIS — I10 Essential (primary) hypertension: Secondary | ICD-10-CM | POA: Diagnosis not present

## 2016-12-01 DIAGNOSIS — K222 Esophageal obstruction: Secondary | ICD-10-CM | POA: Diagnosis not present

## 2016-12-01 DIAGNOSIS — G309 Alzheimer's disease, unspecified: Secondary | ICD-10-CM | POA: Diagnosis not present

## 2016-12-05 DIAGNOSIS — F0281 Dementia in other diseases classified elsewhere with behavioral disturbance: Secondary | ICD-10-CM | POA: Diagnosis not present

## 2016-12-05 DIAGNOSIS — Z9183 Wandering in diseases classified elsewhere: Secondary | ICD-10-CM | POA: Diagnosis not present

## 2016-12-05 DIAGNOSIS — K219 Gastro-esophageal reflux disease without esophagitis: Secondary | ICD-10-CM | POA: Diagnosis not present

## 2016-12-05 DIAGNOSIS — G309 Alzheimer's disease, unspecified: Secondary | ICD-10-CM | POA: Diagnosis not present

## 2016-12-05 DIAGNOSIS — I10 Essential (primary) hypertension: Secondary | ICD-10-CM | POA: Diagnosis not present

## 2016-12-05 DIAGNOSIS — K222 Esophageal obstruction: Secondary | ICD-10-CM | POA: Diagnosis not present

## 2016-12-06 DIAGNOSIS — F331 Major depressive disorder, recurrent, moderate: Secondary | ICD-10-CM | POA: Diagnosis not present

## 2016-12-06 DIAGNOSIS — F0391 Unspecified dementia with behavioral disturbance: Secondary | ICD-10-CM | POA: Diagnosis not present

## 2016-12-07 DIAGNOSIS — K219 Gastro-esophageal reflux disease without esophagitis: Secondary | ICD-10-CM | POA: Diagnosis not present

## 2016-12-07 DIAGNOSIS — Z9183 Wandering in diseases classified elsewhere: Secondary | ICD-10-CM | POA: Diagnosis not present

## 2016-12-07 DIAGNOSIS — F0281 Dementia in other diseases classified elsewhere with behavioral disturbance: Secondary | ICD-10-CM | POA: Diagnosis not present

## 2016-12-07 DIAGNOSIS — K222 Esophageal obstruction: Secondary | ICD-10-CM | POA: Diagnosis not present

## 2016-12-07 DIAGNOSIS — I10 Essential (primary) hypertension: Secondary | ICD-10-CM | POA: Diagnosis not present

## 2016-12-07 DIAGNOSIS — G309 Alzheimer's disease, unspecified: Secondary | ICD-10-CM | POA: Diagnosis not present

## 2016-12-09 DIAGNOSIS — Z9183 Wandering in diseases classified elsewhere: Secondary | ICD-10-CM | POA: Diagnosis not present

## 2016-12-09 DIAGNOSIS — G309 Alzheimer's disease, unspecified: Secondary | ICD-10-CM | POA: Diagnosis not present

## 2016-12-09 DIAGNOSIS — K219 Gastro-esophageal reflux disease without esophagitis: Secondary | ICD-10-CM | POA: Diagnosis not present

## 2016-12-09 DIAGNOSIS — F0281 Dementia in other diseases classified elsewhere with behavioral disturbance: Secondary | ICD-10-CM | POA: Diagnosis not present

## 2016-12-09 DIAGNOSIS — K222 Esophageal obstruction: Secondary | ICD-10-CM | POA: Diagnosis not present

## 2016-12-09 DIAGNOSIS — I10 Essential (primary) hypertension: Secondary | ICD-10-CM | POA: Diagnosis not present

## 2016-12-13 DIAGNOSIS — K222 Esophageal obstruction: Secondary | ICD-10-CM | POA: Diagnosis not present

## 2016-12-13 DIAGNOSIS — K219 Gastro-esophageal reflux disease without esophagitis: Secondary | ICD-10-CM | POA: Diagnosis not present

## 2016-12-13 DIAGNOSIS — G309 Alzheimer's disease, unspecified: Secondary | ICD-10-CM | POA: Diagnosis not present

## 2016-12-13 DIAGNOSIS — Z9183 Wandering in diseases classified elsewhere: Secondary | ICD-10-CM | POA: Diagnosis not present

## 2016-12-13 DIAGNOSIS — F0281 Dementia in other diseases classified elsewhere with behavioral disturbance: Secondary | ICD-10-CM | POA: Diagnosis not present

## 2016-12-13 DIAGNOSIS — I10 Essential (primary) hypertension: Secondary | ICD-10-CM | POA: Diagnosis not present

## 2016-12-14 DIAGNOSIS — Z9183 Wandering in diseases classified elsewhere: Secondary | ICD-10-CM | POA: Diagnosis not present

## 2016-12-14 DIAGNOSIS — G309 Alzheimer's disease, unspecified: Secondary | ICD-10-CM | POA: Diagnosis not present

## 2016-12-14 DIAGNOSIS — K222 Esophageal obstruction: Secondary | ICD-10-CM | POA: Diagnosis not present

## 2016-12-14 DIAGNOSIS — K219 Gastro-esophageal reflux disease without esophagitis: Secondary | ICD-10-CM | POA: Diagnosis not present

## 2016-12-14 DIAGNOSIS — I10 Essential (primary) hypertension: Secondary | ICD-10-CM | POA: Diagnosis not present

## 2016-12-14 DIAGNOSIS — F0281 Dementia in other diseases classified elsewhere with behavioral disturbance: Secondary | ICD-10-CM | POA: Diagnosis not present

## 2016-12-15 DIAGNOSIS — G309 Alzheimer's disease, unspecified: Secondary | ICD-10-CM | POA: Diagnosis not present

## 2016-12-15 DIAGNOSIS — K219 Gastro-esophageal reflux disease without esophagitis: Secondary | ICD-10-CM | POA: Diagnosis not present

## 2016-12-15 DIAGNOSIS — F0281 Dementia in other diseases classified elsewhere with behavioral disturbance: Secondary | ICD-10-CM | POA: Diagnosis not present

## 2016-12-15 DIAGNOSIS — K222 Esophageal obstruction: Secondary | ICD-10-CM | POA: Diagnosis not present

## 2016-12-15 DIAGNOSIS — I10 Essential (primary) hypertension: Secondary | ICD-10-CM | POA: Diagnosis not present

## 2016-12-15 DIAGNOSIS — Z9183 Wandering in diseases classified elsewhere: Secondary | ICD-10-CM | POA: Diagnosis not present

## 2016-12-16 ENCOUNTER — Encounter (HOSPITAL_COMMUNITY): Payer: Self-pay | Admitting: Nurse Practitioner

## 2016-12-16 ENCOUNTER — Emergency Department (HOSPITAL_COMMUNITY): Payer: Medicare Other

## 2016-12-16 ENCOUNTER — Emergency Department (HOSPITAL_COMMUNITY)
Admission: EM | Admit: 2016-12-16 | Discharge: 2016-12-16 | Disposition: A | Discharge status: Home / Self Care | Payer: Medicare Other | Attending: Emergency Medicine | Admitting: Emergency Medicine

## 2016-12-16 DIAGNOSIS — K222 Esophageal obstruction: Secondary | ICD-10-CM | POA: Diagnosis not present

## 2016-12-16 DIAGNOSIS — R001 Bradycardia, unspecified: Secondary | ICD-10-CM | POA: Diagnosis not present

## 2016-12-16 DIAGNOSIS — Z7901 Long term (current) use of anticoagulants: Secondary | ICD-10-CM | POA: Diagnosis not present

## 2016-12-16 DIAGNOSIS — Z79899 Other long term (current) drug therapy: Secondary | ICD-10-CM | POA: Diagnosis not present

## 2016-12-16 DIAGNOSIS — I442 Atrioventricular block, complete: Secondary | ICD-10-CM | POA: Insufficient documentation

## 2016-12-16 DIAGNOSIS — I10 Essential (primary) hypertension: Secondary | ICD-10-CM | POA: Diagnosis not present

## 2016-12-16 DIAGNOSIS — I820 Budd-Chiari syndrome: Secondary | ICD-10-CM | POA: Diagnosis not present

## 2016-12-16 DIAGNOSIS — F0390 Unspecified dementia without behavioral disturbance: Secondary | ICD-10-CM | POA: Diagnosis not present

## 2016-12-16 DIAGNOSIS — G309 Alzheimer's disease, unspecified: Secondary | ICD-10-CM | POA: Diagnosis not present

## 2016-12-16 DIAGNOSIS — J441 Chronic obstructive pulmonary disease with (acute) exacerbation: Secondary | ICD-10-CM | POA: Insufficient documentation

## 2016-12-16 DIAGNOSIS — Z87891 Personal history of nicotine dependence: Secondary | ICD-10-CM | POA: Diagnosis not present

## 2016-12-16 DIAGNOSIS — K219 Gastro-esophageal reflux disease without esophagitis: Secondary | ICD-10-CM | POA: Diagnosis not present

## 2016-12-16 DIAGNOSIS — Z6822 Body mass index (BMI) 22.0-22.9, adult: Secondary | ICD-10-CM | POA: Diagnosis not present

## 2016-12-16 DIAGNOSIS — F0281 Dementia in other diseases classified elsewhere with behavioral disturbance: Secondary | ICD-10-CM | POA: Diagnosis not present

## 2016-12-16 DIAGNOSIS — I517 Cardiomegaly: Secondary | ICD-10-CM | POA: Diagnosis not present

## 2016-12-16 DIAGNOSIS — Z9183 Wandering in diseases classified elsewhere: Secondary | ICD-10-CM | POA: Diagnosis not present

## 2016-12-16 DIAGNOSIS — I480 Paroxysmal atrial fibrillation: Secondary | ICD-10-CM | POA: Diagnosis not present

## 2016-12-16 HISTORY — DX: Atrioventricular block, complete: I44.2

## 2016-12-16 HISTORY — DX: Unspecified dementia, unspecified severity, without behavioral disturbance, psychotic disturbance, mood disturbance, and anxiety: F03.90

## 2016-12-16 LAB — COMPREHENSIVE METABOLIC PANEL
ALT: 24 U/L (ref 17–63)
AST: 33 U/L (ref 15–41)
Albumin: 3.5 g/dL (ref 3.5–5.0)
Alkaline Phosphatase: 77 U/L (ref 38–126)
Anion gap: 9 (ref 5–15)
BILIRUBIN TOTAL: 0.6 mg/dL (ref 0.3–1.2)
BUN: 47 mg/dL — AB (ref 6–20)
CALCIUM: 8.8 mg/dL — AB (ref 8.9–10.3)
CO2: 22 mmol/L (ref 22–32)
Chloride: 109 mmol/L (ref 101–111)
Creatinine, Ser: 1.74 mg/dL — ABNORMAL HIGH (ref 0.61–1.24)
GFR, EST AFRICAN AMERICAN: 38 mL/min — AB (ref >60)
GFR, EST NON AFRICAN AMERICAN: 33 mL/min — AB (ref >60)
Glucose, Bld: 97 mg/dL (ref 65–99)
Potassium: 4.6 mmol/L (ref 3.5–5.1)
Sodium: 140 mmol/L (ref 135–145)
TOTAL PROTEIN: 6.6 g/dL (ref 6.5–8.1)

## 2016-12-16 LAB — I-STAT CHEM 8, ED
BUN: 47 mg/dL — ABNORMAL HIGH (ref 6–20)
CALCIUM ION: 1.15 mmol/L (ref 1.15–1.40)
CHLORIDE: 107 mmol/L (ref 101–111)
Creatinine, Ser: 1.7 mg/dL — ABNORMAL HIGH (ref 0.61–1.24)
Glucose, Bld: 94 mg/dL (ref 65–99)
HEMATOCRIT: 42 % (ref 39.0–52.0)
Hemoglobin: 14.3 g/dL (ref 13.0–17.0)
POTASSIUM: 4.5 mmol/L (ref 3.5–5.1)
SODIUM: 143 mmol/L (ref 135–145)
TCO2: 23 mmol/L (ref 0–100)

## 2016-12-16 LAB — CBC WITH DIFFERENTIAL/PLATELET
BASOS PCT: 0 %
Basophils Absolute: 0 10*3/uL (ref 0.0–0.1)
EOS ABS: 0.1 10*3/uL (ref 0.0–0.7)
EOS PCT: 2 %
HCT: 40.6 % (ref 39.0–52.0)
Hemoglobin: 13.2 g/dL (ref 13.0–17.0)
Lymphocytes Relative: 16 %
Lymphs Abs: 1.2 10*3/uL (ref 0.7–4.0)
MCH: 28 pg (ref 26.0–34.0)
MCHC: 32.5 g/dL (ref 30.0–36.0)
MCV: 86.2 fL (ref 78.0–100.0)
Monocytes Absolute: 0.4 10*3/uL (ref 0.1–1.0)
Monocytes Relative: 6 %
Neutro Abs: 5.8 10*3/uL (ref 1.7–7.7)
Neutrophils Relative %: 76 %
Platelets: 282 10*3/uL (ref 150–400)
RBC: 4.71 MIL/uL (ref 4.22–5.81)
RDW: 15.3 % (ref 11.5–15.5)
WBC: 7.6 10*3/uL (ref 4.0–10.5)

## 2016-12-16 MED ORDER — APIXABAN 2.5 MG PO TABS: 2.5000 mg | ORAL_TABLET | Freq: Two times a day (BID) | ORAL | 11 refills | Status: AC

## 2016-12-16 MED ORDER — ALPRAZOLAM 0.25 MG PO TABS
0.5000 mg | ORAL_TABLET | Freq: Once | ORAL | Status: AC
  Administered 2016-12-16: 0.5 mg via ORAL
  Filled 2016-12-16: qty 2

## 2016-12-16 NOTE — Consult Note (Addendum)
CARDIOLOGY CONSULT NOTE   Patient ID: William Cisneros MRN: [id number] DOB/AGE: Jul 21, 1924 83 y.o.  Admit date: 12/16/2016  Primary Physician   Velna Hatchet, MD Primary Cardiologist   Dr Acie Fredrickson 05/24/2016 Reason for Consultation   [facility] Requesting MD: Dr Tamera Punt  QPY:[id number] William Cisneros is a 83 y.o. year old male with a history of PAF on Eliquis, dementia, FTT, HTN, abnl blood sugars, COPD. CHADS2VASC=3 (age x 2, HTN)  When seen by Dr Acie Fredrickson, he was steadily losing weight, good family support but needing more and more care. No palpitations or bleeding issues, in SR. No rate-lowering meds.   Per the family, he was moved to Memory Care at [facility] this morning. A vitals check by staff showed bradycardia. He was then sent to his PCP who confirmed bradycardia and recommended reporting to the ED. On arrival, EKG showed third degree heart block. Cards asked to see.  The patient is agitated and upset about being in the emergency room. He denies chest pain, shortness of breath, palpitations, and syncope. He denies weakness, lightheadedness, nausea, vomiting, and recent falls.   He is widowed.  Pt denies CP, SOB, DOE, LE edema, orthopnea or PND. States he feels fine. No palpitations, no awareness of any episode of atrial fib. No history of presyncope or syncope.  He is upset that someone yanked him out of his home, but he is referring to the family home, has been in [place] for a while.     Past Medical History:  Diagnosis Date  . Complete heart block ([place]) 12/16/2016  . COPD (chronic obstructive pulmonary disease) ([place])   . Dementia   . Hyperglycemia    pt states that he has  "hypo" glycemia  . Hypertension   . Hypoglycemia      Past Surgical History:  Procedure Laterality Date  . COLONOSCOPY    . [place]  . VEIN LIGATION AND STRIPPING     rt leg    No Known Allergies  I have reviewed the patient's current medications Prior to Admission medications    Medication Sig Start Date End Date Taking? Authorizing Provider  apixaban (ELIQUIS) 5 MG TABS tablet Take 1 tablet (5 mg total) by mouth 2 (two) times daily. 10/15/15   Thayer Headings, MD  SERTRALINE HCL PO Take by mouth.    Historical Provider, MD  sodium chloride (OCEAN) 0.65 % SOLN nasal spray Place 1 spray into both nostrils as needed for congestion. 08/27/16   Monico Blitz, PA-C     Social History   Social History  . Marital status: Widowed    Spouse name: N/A  . Number of children: 2  . Years of education: N/A   Occupational History  . RETIRED Retired   Social History Main Topics  . Smoking status: Former Smoker    Quit date: 09/26/1966  . Smokeless tobacco: Never Used  . Alcohol use Yes     Comment: occassional  . Drug use: No  . Sexual activity: Not on file   Other Topics Concern  . Not on file   Social History Narrative   Pt is in Assisted Living in [place].    Family Status  Relation Status  . Father Deceased  . Mother Deceased  . Neg Hx    Family History  Problem Relation Age of Onset  . Alcoholism Father   . Colon cancer Neg Hx   . Esophageal cancer Neg Hx   . Stomach  cancer Neg Hx      ROS:  Full 14 point review of systems complete and found to be negative unless listed above.  Physical Exam: Blood pressure (!) 177/43, pulse (!) 32, resp. rate 17, SpO2 98 %.  General: Well developed, well nourished, male in no acute distress Head: Eyes PERRLA, No xanthomas.   Normocephalic and atraumatic, oropharynx without edema or exudate. Dentition: poor Lungs: good air exchange w/ scattered rales, no wheeze Heart: Regular rhythm, bradycardic rate, S1 S2, no rub/gallop, pulses are 2+ both upper extrem. Pedal pulses decreased. No sig murmur. Neck: No carotid bruits. No lymphadenopathy.  JVD elevated 10 cm Abdomen: Bowel sounds present, abdomen soft and non-tender without masses or hernias noted. Msk:  No spine or cva tenderness. No weakness, no joint  deformities or effusions. Extremities: No clubbing or cyanosis. No edema. Neuro: Alert and oriented X 2 No focal deficits noted. Psych:  Good affect, responds appropriately Skin: No rashes or lesions noted.  Labs:   Lab Results  Component Value Date   WBC 7.6 12/16/2016   HGB 14.3 12/16/2016   HCT 42.0 12/16/2016   MCV 86.2 12/16/2016   PLT 282 12/16/2016    Recent Labs Lab 12/16/16 1537 12/16/16 1544  NA 140 143  K 4.6 4.5  CL 109 107  CO2 22  --   BUN 47* 47*  CREATININE 1.74* 1.70*  CALCIUM 8.8*  --   PROT 6.6  --   BILITOT 0.6  --   ALKPHOS 77  --   ALT 24  --   AST 33  --   GLUCOSE 97 94  ALBUMIN 3.5  --    TSH  Date/Time Value Ref Range Status  09/29/2015 06:01 AM 2.241 0.350 - 4.500 uIU/mL Final  09/21/2010 03:26 AM 0.575 0.350 - 4.500 uIU/mL Final   Echo: 09/29/2015 - Left ventricle: The cavity size was normal. There was mild focal   basal hypertrophy of the septum. Systolic function was normal.   The estimated ejection fraction was in the range of 60% to 65%.   Wall motion was normal; there were no regional wall motion   abnormalities. Doppler parameters are consistent with abnormal   left ventricular relaxation (grade 1 diastolic dysfunction).   There was no evidence of elevated ventricular filling pressure by   Doppler parameters. - Aortic valve: Trileaflet; mildly thickened, mildly calcified   leaflets. Transvalvular velocity was within the normal range.   There was no stenosis. There was no regurgitation. - Aortic root: The aortic root was normal in size. - Mitral valve: Structurally normal valve. There was no   regurgitation. - Left atrium: The atrium was normal in size. - Right ventricle: The cavity size was normal. Wall thickness was   normal. Systolic function was normal. - Tricuspid valve: There was mild regurgitation. - Pulmonic valve: There was no regurgitation. - Pulmonary arteries: Systolic pressure was within the normal   range. -  Inferior vena cava: The vessel was normal in size. - Pericardium, extracardiac: There was no pericardial effusion.  ECG:  Third degree heart block, ventricular rate 37  Radiology:  Dg Chest Port 1 View Result Date: 12/16/2016 CLINICAL DATA:  Bradycardia EXAM: PORTABLE CHEST 1 VIEW COMPARISON:  09/30/2015 FINDINGS: Hyperinflation again evident compatible with COPD/emphysema. Mild cardiomegaly with vascular congestion and basilar atelectasis versus scarring. Similar pattern of overlying calcified pleural plaques, better demonstrated by comparison chest CT. No large effusion. Negative for pneumothorax. Trachea is midline. Atherosclerosis noted of the aorta. Defibrillator  pads overlie the chest. IMPRESSION: Cardiomegaly with vascular congestion. Hyperinflation compatible with COPD/ emphysema Bibasilar atelectasis versus scarring Bilateral calcified pleural plaques No significant interval change. Electronically Signed   By: Jerilynn Mages.  Shick M.D.   On: 12/16/2016 16:11    ASSESSMENT AND PLAN:   The patient was seen today by Dr Stanford Breed, the patient evaluated and the data reviewed.   Principal Problem:   Complete heart block (HCC) - HR is high 20s and 30s, - pt not on obvious rate-lowering rx, Aricept may be contributing per pharmacy.  - Zoloft was increased from 25 mg qd>>50 mg qd on 02/15 - no obvious sx from the heart block  - Code status discussed with family, he has a living will but has never been a DNR - because of the dementia, son is POA. - pt says does not want PPM.  Active Problems:   Hypertension - BP is elevated now, no sx from that.  Dr Stanford Breed discussed the situation with the son (POA), daughter and daughter-in-law. After lengthy discussion, a decision was made that he should be a DNR and no PPM will be inserted.   ER MD was updated, out-of-hospital DNR was signed.   SignedLenoard Aden 12/16/2016 4:49 PM Beeper [phone]  Co-Sign MD As above, patient seen and examined.  He is a 83 year old male with past medical history of paroxysmal atrial fibrillation, dementia, hypertension and COPD for evaluation of complete heart block. Patient does have significant dementia but denies dyspnea, chest pain, palpitations, dizziness or syncope. He states he feels the best he has in years. Patient was moved to memory care at [facility] this morning. His vitals were checked and he was noted to be bradycardic. He presented to the emergency room because of his bradycardia and was found to be in complete heart block. Cardiology now asked to evaluate. Heart rate is 32 and blood pressure 170/55. Electrocardiogram shows complete heart block with right bundle branch block. Laboratory significant for potassium 4.5, BUN 47, creatinine 1.70, hemoglobin 14.3.  1 complete heart block-the patient is asymptomatic at this point. I have reviewed his medications and he is on no AV nodal blocking agents. Aricept can occasionally cause bradycardia and this will be discontinued. I had a long discussion with the patient and his family including his son who is power of attorney. His dementia is progressing and he now was moved to a memory care unit. We discussed aggressiveness of care. The patient stated he did not want a pacemaker. However it is not clear he is capable of making his own decisions. I therefore discussed this further with his son and his daughter. They feel that given his age and progressive dementia that we should not proceed with heroic measures including pacemaker. They understand that progression of this rhythm disturbance is unpredictable and he would be potentially at risk for ventricular arrhythmia which could cause death. We also discussed CODE STATUS. They have requested that he be a no CODE BLUE including no defibrillation, no CPR and no intubation. I believe the above course is appropriate.  2 acute kidney disease-patient's BUN and creatinine are mildly elevated compared to previous.  Would increase by mouth fluid intake at home if possible. Follow-up laboratories with his primary care physician.  3 dementia-we have discontinued Aricept with a small chance that it could be contributing to his bradycardia. He will follow-up with his primary care physician for other possible medications.  4 PAF-Avoid AV nodal blocking agents; continue apixaban; would decrease to  2.5 mg BID as he is 89 and his Cr now 1.7.  Kirk Ruths, MD

## 2016-12-16 NOTE — ED Notes (Signed)
Cardiology at bedside talking with family , pt resting no complaints

## 2016-12-16 NOTE — Discharge Instructions (Addendum)
Pt BUN/Cr are higher than normal for him. Please encourage PO/liquid intake, unless he has SOB or edema. Decrease Eliquis dose

## 2016-12-16 NOTE — ED Notes (Signed)
Pt aggitated.  PT given xanax per family request and per norm.

## 2016-12-16 NOTE — ED Provider Notes (Signed)
[facility] DEPT Provider Note   CSN: [id number] Arrival date & time: 12/16/16  1437     History   Chief Complaint Chief Complaint  Patient presents with  . Bradycardia    HPI William Cisneros is a 83 y.o. male.  Patient is a 83 year old male with a history of dementia, COPD, hypertension and paroxysmal atrial fibrillation, on Eliquis who presents with bradycardia. On routine vital signs this morning, the nursing home discovered the patient was markedly bradycardic with a heart rate in the 30s and 40s and arrange for an appointment with his PCP. After being evaluated by his PCP, he was sent here to the emergency department due to severe bradycardia and associated heart block. His son is here who is apparently returning and states that patient is a little less active today but otherwise his baseline mental status. There's been no recent fevers coughing cold or other recent illnesses. History is limited due to the patient's dementia.      Past Medical History:  Diagnosis Date  . Complete heart block ([place]) 12/16/2016  . COPD (chronic obstructive pulmonary disease) ([place])   . Dementia   . Hyperglycemia    pt states that he has  "hypo" glycemia  . Hypertension   . Hypoglycemia     Patient Active Problem List   Diagnosis Date Noted  . Complete heart block ([place]) 12/16/2016  . Failure to thrive in adult 09/29/2015  . Paroxysmal atrial fibrillation ([place]) 09/29/2015  . Weakness 09/29/2015  . Atrial fibrillation ([place]) 09/29/2015  . Protein-calorie malnutrition, severe 09/29/2015  . Epistaxis 09/07/2013  . Chest pain 03/06/2012  . Dysphagia, unspecified(787.20) 03/05/2012  . Hypertension   . Hyperglycemia     Past Surgical History:  Procedure Laterality Date  . COLONOSCOPY    . [place]  . VEIN LIGATION AND STRIPPING     rt leg       Home Medications    Prior to Admission medications   Medication Sig Start Date End Date Taking? Authorizing Provider    ALPRAZolam Duanne Moron) 0.5 MG tablet Take 0.5 mg by mouth at bedtime as needed for anxiety.   Yes Historical Provider, MD  donepezil (ARICEPT) 10 MG tablet Take 10 mg by mouth at bedtime.   Yes Historical Provider, MD  sertraline (ZOLOFT) 50 MG tablet Take 50 mg by mouth daily.   Yes Historical Provider, MD  sodium chloride (OCEAN) 0.65 % SOLN nasal spray Place 1 spray into both nostrils as needed for congestion. 08/27/16  Yes Nicole Pisciotta, PA-C  apixaban (ELIQUIS) 2.5 MG TABS tablet Take 1 tablet (2.5 mg total) by mouth 2 (two) times daily. 12/16/16   Evelene Croon Barrett, PA-C    Family History Family History  Problem Relation Age of Onset  . Alcoholism Father   . Colon cancer Neg Hx   . Esophageal cancer Neg Hx   . Stomach cancer Neg Hx     Social History Social History  Substance Use Topics  . Smoking status: Former Smoker    Quit date: 09/26/1966  . Smokeless tobacco: Never Used  . Alcohol use Yes     Comment: occassional     Allergies   Patient has no known allergies.   Review of Systems Review of Systems  Unable to perform ROS: Dementia     Physical Exam Updated Vital Signs BP (!) 177/50   Pulse (!) 31   Resp 13   SpO2 100%   Physical Exam  Constitutional: He appears well-developed  and well-nourished.  HENT:  Head: Normocephalic and atraumatic.  Eyes: Pupils are equal, round, and reactive to light.  Neck: Normal range of motion. Neck supple.  Cardiovascular: Regular rhythm and normal heart sounds.  Bradycardia present.   Pulmonary/Chest: Effort normal and breath sounds normal. No respiratory distress. He has no wheezes. He has no rales. He exhibits no tenderness.  Abdominal: Soft. Bowel sounds are normal. There is no tenderness. There is no rebound and no guarding.  Musculoskeletal: Normal range of motion. He exhibits no edema.  Lymphadenopathy:    He has no cervical adenopathy.  Neurological: He is alert.  confused  Skin: Skin is warm and dry. No rash  noted.  Psychiatric: He has a normal mood and affect.     ED Treatments / Results  Labs (all labs ordered are listed, but only abnormal results are displayed) Labs Reviewed  COMPREHENSIVE METABOLIC PANEL - Abnormal; Notable for the following:       Result Value   BUN 47 (*)    Creatinine, Ser 1.74 (*)    Calcium 8.8 (*)    GFR calc non Af Amer 33 (*)    GFR calc Af Amer 38 (*)    All other components within normal limits  I-STAT CHEM 8, ED - Abnormal; Notable for the following:    BUN 47 (*)    Creatinine, Ser 1.70 (*)    All other components within normal limits  CBC WITH DIFFERENTIAL/PLATELET    EKG  EKG Interpretation  Date/Time:  Friday December 16 2016 14:45:08 EDT Ventricular Rate:  37 PR Interval:    QRS Duration: 138 QT Interval:  510 QTC Calculation: 400 R Axis:   68 Text Interpretation: Critical Test Result: Arrhythmia , AV Block Marked sinus bradycardia with A-V dissociation and Idioventricular rhythm Right bundle branch block Abnormal ECG Confirmed by Yovan Leeman  MD, Devlynn Knoff ([id number]) on 12/16/2016 3:08:41 PM       Radiology Dg Chest Port 1 View  Result Date: 12/16/2016 CLINICAL DATA:  Bradycardia EXAM: PORTABLE CHEST 1 VIEW COMPARISON:  09/30/2015 FINDINGS: Hyperinflation again evident compatible with COPD/emphysema. Mild cardiomegaly with vascular congestion and basilar atelectasis versus scarring. Similar pattern of overlying calcified pleural plaques, better demonstrated by comparison chest CT. No large effusion. Negative for pneumothorax. Trachea is midline. Atherosclerosis noted of the aorta. Defibrillator pads overlie the chest. IMPRESSION: Cardiomegaly with vascular congestion. Hyperinflation compatible with COPD/ emphysema Bibasilar atelectasis versus scarring Bilateral calcified pleural plaques No significant interval change. Electronically Signed   By: Jerilynn Mages.  Shick M.D.   On: 12/16/2016 16:11    Procedures Procedures (including critical care time)  Medications  Ordered in ED Medications  ALPRAZolam Duanne Moron) tablet 0.5 mg (0.5 mg Oral Given 12/16/16 1830)     Initial Impression / Assessment and Plan / ED Course  I have reviewed the triage vital signs and the nursing notes.  Pertinent labs & imaging results that were available during my care of the patient were reviewed by me and considered in my medical decision making (see chart for details).  Clinical Course as of Dec 16 1937  Fri Dec 16, 2016  1541 Spoke with cardiology PA who will see the pt.  Had a long discussion with the son who is POA regarding whether or not pt would want pacemaker.  Son is undecided at this point.  Will contact other family members  [MB]    Clinical Course User Index [MB] Malvin Johns, MD    Patient presents with a complete heart.  The son he was apparently returning a discussion with the cardiologist has opted for no treatment. It has been decided that he can be discharged back to the nursing facility. A DO NOT RESUSCITATE form was filled out on the patient here in the ED. The nursing home was contacted. They have arranged with the patient's PCP to start hospice. Patient is discharged back to nursing facility.  Final Clinical Impressions(s) / ED Diagnoses   Final diagnoses:  Complete heart block [facility])    New Prescriptions Current Discharge Medication List       Malvin Johns, MD 12/16/16 1940

## 2016-12-16 NOTE — ED Triage Notes (Signed)
Presents with c/o low pulse rate. The low pulse rate started this morning. Nursing facility staff found the low pulse rate on morning rounds this morning. The patients son picked pt up from nursing facility and took him to doctors office where pulse was around 37-40 so he was instructed to come to ED. The patient has dementia but is alert and at baseline per son.

## 2016-12-17 DIAGNOSIS — I10 Essential (primary) hypertension: Secondary | ICD-10-CM | POA: Diagnosis not present

## 2016-12-17 DIAGNOSIS — I519 Heart disease, unspecified: Secondary | ICD-10-CM | POA: Diagnosis not present

## 2016-12-17 DIAGNOSIS — I4891 Unspecified atrial fibrillation: Secondary | ICD-10-CM | POA: Diagnosis not present

## 2016-12-17 DIAGNOSIS — I442 Atrioventricular block, complete: Secondary | ICD-10-CM | POA: Diagnosis not present

## 2016-12-17 DIAGNOSIS — F411 Generalized anxiety disorder: Secondary | ICD-10-CM | POA: Diagnosis not present

## 2016-12-17 DIAGNOSIS — J449 Chronic obstructive pulmonary disease, unspecified: Secondary | ICD-10-CM | POA: Diagnosis not present

## 2016-12-17 DIAGNOSIS — F339 Major depressive disorder, recurrent, unspecified: Secondary | ICD-10-CM | POA: Diagnosis not present

## 2016-12-17 DIAGNOSIS — N183 Chronic kidney disease, stage 3 (moderate): Secondary | ICD-10-CM | POA: Diagnosis not present

## 2016-12-19 DIAGNOSIS — I10 Essential (primary) hypertension: Secondary | ICD-10-CM | POA: Diagnosis not present

## 2016-12-19 DIAGNOSIS — I4891 Unspecified atrial fibrillation: Secondary | ICD-10-CM | POA: Diagnosis not present

## 2016-12-19 DIAGNOSIS — N183 Chronic kidney disease, stage 3 (moderate): Secondary | ICD-10-CM | POA: Diagnosis not present

## 2016-12-19 DIAGNOSIS — I519 Heart disease, unspecified: Secondary | ICD-10-CM | POA: Diagnosis not present

## 2016-12-19 DIAGNOSIS — I442 Atrioventricular block, complete: Secondary | ICD-10-CM | POA: Diagnosis not present

## 2016-12-19 DIAGNOSIS — J449 Chronic obstructive pulmonary disease, unspecified: Secondary | ICD-10-CM | POA: Diagnosis not present

## 2016-12-20 DIAGNOSIS — I4891 Unspecified atrial fibrillation: Secondary | ICD-10-CM | POA: Diagnosis not present

## 2016-12-20 DIAGNOSIS — J449 Chronic obstructive pulmonary disease, unspecified: Secondary | ICD-10-CM | POA: Diagnosis not present

## 2016-12-20 DIAGNOSIS — I519 Heart disease, unspecified: Secondary | ICD-10-CM | POA: Diagnosis not present

## 2016-12-20 DIAGNOSIS — I10 Essential (primary) hypertension: Secondary | ICD-10-CM | POA: Diagnosis not present

## 2016-12-20 DIAGNOSIS — I442 Atrioventricular block, complete: Secondary | ICD-10-CM | POA: Diagnosis not present

## 2016-12-20 DIAGNOSIS — N183 Chronic kidney disease, stage 3 (moderate): Secondary | ICD-10-CM | POA: Diagnosis not present

## 2016-12-21 DIAGNOSIS — J449 Chronic obstructive pulmonary disease, unspecified: Secondary | ICD-10-CM | POA: Diagnosis not present

## 2016-12-21 DIAGNOSIS — F331 Major depressive disorder, recurrent, moderate: Secondary | ICD-10-CM | POA: Diagnosis not present

## 2016-12-21 DIAGNOSIS — I442 Atrioventricular block, complete: Secondary | ICD-10-CM | POA: Diagnosis not present

## 2016-12-21 DIAGNOSIS — I519 Heart disease, unspecified: Secondary | ICD-10-CM | POA: Diagnosis not present

## 2016-12-21 DIAGNOSIS — N183 Chronic kidney disease, stage 3 (moderate): Secondary | ICD-10-CM | POA: Diagnosis not present

## 2016-12-21 DIAGNOSIS — F0391 Unspecified dementia with behavioral disturbance: Secondary | ICD-10-CM | POA: Diagnosis not present

## 2016-12-21 DIAGNOSIS — I10 Essential (primary) hypertension: Secondary | ICD-10-CM | POA: Diagnosis not present

## 2016-12-21 DIAGNOSIS — I4891 Unspecified atrial fibrillation: Secondary | ICD-10-CM | POA: Diagnosis not present

## 2016-12-22 DIAGNOSIS — I442 Atrioventricular block, complete: Secondary | ICD-10-CM | POA: Diagnosis not present

## 2016-12-22 DIAGNOSIS — I4891 Unspecified atrial fibrillation: Secondary | ICD-10-CM | POA: Diagnosis not present

## 2016-12-22 DIAGNOSIS — I10 Essential (primary) hypertension: Secondary | ICD-10-CM | POA: Diagnosis not present

## 2016-12-22 DIAGNOSIS — I519 Heart disease, unspecified: Secondary | ICD-10-CM | POA: Diagnosis not present

## 2016-12-22 DIAGNOSIS — N183 Chronic kidney disease, stage 3 (moderate): Secondary | ICD-10-CM | POA: Diagnosis not present

## 2016-12-22 DIAGNOSIS — J449 Chronic obstructive pulmonary disease, unspecified: Secondary | ICD-10-CM | POA: Diagnosis not present

## 2016-12-25 DIAGNOSIS — F339 Major depressive disorder, recurrent, unspecified: Secondary | ICD-10-CM | POA: Diagnosis not present

## 2016-12-25 DIAGNOSIS — F411 Generalized anxiety disorder: Secondary | ICD-10-CM | POA: Diagnosis not present

## 2016-12-25 DIAGNOSIS — I442 Atrioventricular block, complete: Secondary | ICD-10-CM | POA: Diagnosis not present

## 2016-12-25 DIAGNOSIS — I519 Heart disease, unspecified: Secondary | ICD-10-CM | POA: Diagnosis not present

## 2016-12-25 DIAGNOSIS — J449 Chronic obstructive pulmonary disease, unspecified: Secondary | ICD-10-CM | POA: Diagnosis not present

## 2016-12-25 DIAGNOSIS — N183 Chronic kidney disease, stage 3 (moderate): Secondary | ICD-10-CM | POA: Diagnosis not present

## 2016-12-25 DIAGNOSIS — I4891 Unspecified atrial fibrillation: Secondary | ICD-10-CM | POA: Diagnosis not present

## 2016-12-25 DIAGNOSIS — I10 Essential (primary) hypertension: Secondary | ICD-10-CM | POA: Diagnosis not present

## 2016-12-27 DIAGNOSIS — I10 Essential (primary) hypertension: Secondary | ICD-10-CM | POA: Diagnosis not present

## 2016-12-27 DIAGNOSIS — I4891 Unspecified atrial fibrillation: Secondary | ICD-10-CM | POA: Diagnosis not present

## 2016-12-27 DIAGNOSIS — I442 Atrioventricular block, complete: Secondary | ICD-10-CM | POA: Diagnosis not present

## 2016-12-27 DIAGNOSIS — I519 Heart disease, unspecified: Secondary | ICD-10-CM | POA: Diagnosis not present

## 2016-12-27 DIAGNOSIS — N183 Chronic kidney disease, stage 3 (moderate): Secondary | ICD-10-CM | POA: Diagnosis not present

## 2016-12-27 DIAGNOSIS — J449 Chronic obstructive pulmonary disease, unspecified: Secondary | ICD-10-CM | POA: Diagnosis not present

## 2016-12-28 DIAGNOSIS — I442 Atrioventricular block, complete: Secondary | ICD-10-CM | POA: Diagnosis not present

## 2016-12-28 DIAGNOSIS — J449 Chronic obstructive pulmonary disease, unspecified: Secondary | ICD-10-CM | POA: Diagnosis not present

## 2016-12-28 DIAGNOSIS — I4891 Unspecified atrial fibrillation: Secondary | ICD-10-CM | POA: Diagnosis not present

## 2016-12-28 DIAGNOSIS — I519 Heart disease, unspecified: Secondary | ICD-10-CM | POA: Diagnosis not present

## 2016-12-28 DIAGNOSIS — I10 Essential (primary) hypertension: Secondary | ICD-10-CM | POA: Diagnosis not present

## 2016-12-28 DIAGNOSIS — N183 Chronic kidney disease, stage 3 (moderate): Secondary | ICD-10-CM | POA: Diagnosis not present

## 2016-12-29 DIAGNOSIS — N183 Chronic kidney disease, stage 3 (moderate): Secondary | ICD-10-CM | POA: Diagnosis not present

## 2016-12-29 DIAGNOSIS — I442 Atrioventricular block, complete: Secondary | ICD-10-CM | POA: Diagnosis not present

## 2016-12-29 DIAGNOSIS — I519 Heart disease, unspecified: Secondary | ICD-10-CM | POA: Diagnosis not present

## 2016-12-29 DIAGNOSIS — I4891 Unspecified atrial fibrillation: Secondary | ICD-10-CM | POA: Diagnosis not present

## 2016-12-29 DIAGNOSIS — J449 Chronic obstructive pulmonary disease, unspecified: Secondary | ICD-10-CM | POA: Diagnosis not present

## 2016-12-29 DIAGNOSIS — I10 Essential (primary) hypertension: Secondary | ICD-10-CM | POA: Diagnosis not present

## 2016-12-30 DIAGNOSIS — J449 Chronic obstructive pulmonary disease, unspecified: Secondary | ICD-10-CM | POA: Diagnosis not present

## 2016-12-30 DIAGNOSIS — I10 Essential (primary) hypertension: Secondary | ICD-10-CM | POA: Diagnosis not present

## 2016-12-30 DIAGNOSIS — I519 Heart disease, unspecified: Secondary | ICD-10-CM | POA: Diagnosis not present

## 2016-12-30 DIAGNOSIS — I442 Atrioventricular block, complete: Secondary | ICD-10-CM | POA: Diagnosis not present

## 2016-12-30 DIAGNOSIS — I4891 Unspecified atrial fibrillation: Secondary | ICD-10-CM | POA: Diagnosis not present

## 2016-12-30 DIAGNOSIS — N183 Chronic kidney disease, stage 3 (moderate): Secondary | ICD-10-CM | POA: Diagnosis not present

## 2017-01-02 DIAGNOSIS — I519 Heart disease, unspecified: Secondary | ICD-10-CM | POA: Diagnosis not present

## 2017-01-02 DIAGNOSIS — N183 Chronic kidney disease, stage 3 (moderate): Secondary | ICD-10-CM | POA: Diagnosis not present

## 2017-01-02 DIAGNOSIS — J449 Chronic obstructive pulmonary disease, unspecified: Secondary | ICD-10-CM | POA: Diagnosis not present

## 2017-01-02 DIAGNOSIS — I10 Essential (primary) hypertension: Secondary | ICD-10-CM | POA: Diagnosis not present

## 2017-01-02 DIAGNOSIS — I4891 Unspecified atrial fibrillation: Secondary | ICD-10-CM | POA: Diagnosis not present

## 2017-01-02 DIAGNOSIS — I442 Atrioventricular block, complete: Secondary | ICD-10-CM | POA: Diagnosis not present

## 2017-01-03 DIAGNOSIS — I10 Essential (primary) hypertension: Secondary | ICD-10-CM | POA: Diagnosis not present

## 2017-01-03 DIAGNOSIS — J449 Chronic obstructive pulmonary disease, unspecified: Secondary | ICD-10-CM | POA: Diagnosis not present

## 2017-01-03 DIAGNOSIS — I519 Heart disease, unspecified: Secondary | ICD-10-CM | POA: Diagnosis not present

## 2017-01-03 DIAGNOSIS — I442 Atrioventricular block, complete: Secondary | ICD-10-CM | POA: Diagnosis not present

## 2017-01-03 DIAGNOSIS — N183 Chronic kidney disease, stage 3 (moderate): Secondary | ICD-10-CM | POA: Diagnosis not present

## 2017-01-03 DIAGNOSIS — I4891 Unspecified atrial fibrillation: Secondary | ICD-10-CM | POA: Diagnosis not present

## 2017-01-05 DIAGNOSIS — J449 Chronic obstructive pulmonary disease, unspecified: Secondary | ICD-10-CM | POA: Diagnosis not present

## 2017-01-05 DIAGNOSIS — I4891 Unspecified atrial fibrillation: Secondary | ICD-10-CM | POA: Diagnosis not present

## 2017-01-05 DIAGNOSIS — I519 Heart disease, unspecified: Secondary | ICD-10-CM | POA: Diagnosis not present

## 2017-01-05 DIAGNOSIS — I10 Essential (primary) hypertension: Secondary | ICD-10-CM | POA: Diagnosis not present

## 2017-01-05 DIAGNOSIS — I442 Atrioventricular block, complete: Secondary | ICD-10-CM | POA: Diagnosis not present

## 2017-01-05 DIAGNOSIS — N183 Chronic kidney disease, stage 3 (moderate): Secondary | ICD-10-CM | POA: Diagnosis not present

## 2017-01-06 DIAGNOSIS — I4891 Unspecified atrial fibrillation: Secondary | ICD-10-CM | POA: Diagnosis not present

## 2017-01-06 DIAGNOSIS — J449 Chronic obstructive pulmonary disease, unspecified: Secondary | ICD-10-CM | POA: Diagnosis not present

## 2017-01-06 DIAGNOSIS — I442 Atrioventricular block, complete: Secondary | ICD-10-CM | POA: Diagnosis not present

## 2017-01-06 DIAGNOSIS — I519 Heart disease, unspecified: Secondary | ICD-10-CM | POA: Diagnosis not present

## 2017-01-06 DIAGNOSIS — I10 Essential (primary) hypertension: Secondary | ICD-10-CM | POA: Diagnosis not present

## 2017-01-06 DIAGNOSIS — N183 Chronic kidney disease, stage 3 (moderate): Secondary | ICD-10-CM | POA: Diagnosis not present

## 2017-01-09 DIAGNOSIS — I4891 Unspecified atrial fibrillation: Secondary | ICD-10-CM | POA: Diagnosis not present

## 2017-01-09 DIAGNOSIS — I519 Heart disease, unspecified: Secondary | ICD-10-CM | POA: Diagnosis not present

## 2017-01-09 DIAGNOSIS — I442 Atrioventricular block, complete: Secondary | ICD-10-CM | POA: Diagnosis not present

## 2017-01-09 DIAGNOSIS — J449 Chronic obstructive pulmonary disease, unspecified: Secondary | ICD-10-CM | POA: Diagnosis not present

## 2017-01-09 DIAGNOSIS — N183 Chronic kidney disease, stage 3 (moderate): Secondary | ICD-10-CM | POA: Diagnosis not present

## 2017-01-09 DIAGNOSIS — I10 Essential (primary) hypertension: Secondary | ICD-10-CM | POA: Diagnosis not present

## 2017-01-10 DIAGNOSIS — J449 Chronic obstructive pulmonary disease, unspecified: Secondary | ICD-10-CM | POA: Diagnosis not present

## 2017-01-10 DIAGNOSIS — N183 Chronic kidney disease, stage 3 (moderate): Secondary | ICD-10-CM | POA: Diagnosis not present

## 2017-01-10 DIAGNOSIS — I4891 Unspecified atrial fibrillation: Secondary | ICD-10-CM | POA: Diagnosis not present

## 2017-01-10 DIAGNOSIS — I519 Heart disease, unspecified: Secondary | ICD-10-CM | POA: Diagnosis not present

## 2017-01-10 DIAGNOSIS — I10 Essential (primary) hypertension: Secondary | ICD-10-CM | POA: Diagnosis not present

## 2017-01-10 DIAGNOSIS — I442 Atrioventricular block, complete: Secondary | ICD-10-CM | POA: Diagnosis not present

## 2017-01-12 DIAGNOSIS — I442 Atrioventricular block, complete: Secondary | ICD-10-CM | POA: Diagnosis not present

## 2017-01-12 DIAGNOSIS — J449 Chronic obstructive pulmonary disease, unspecified: Secondary | ICD-10-CM | POA: Diagnosis not present

## 2017-01-12 DIAGNOSIS — I10 Essential (primary) hypertension: Secondary | ICD-10-CM | POA: Diagnosis not present

## 2017-01-12 DIAGNOSIS — I4891 Unspecified atrial fibrillation: Secondary | ICD-10-CM | POA: Diagnosis not present

## 2017-01-12 DIAGNOSIS — I519 Heart disease, unspecified: Secondary | ICD-10-CM | POA: Diagnosis not present

## 2017-01-12 DIAGNOSIS — N183 Chronic kidney disease, stage 3 (moderate): Secondary | ICD-10-CM | POA: Diagnosis not present

## 2017-01-13 DIAGNOSIS — I10 Essential (primary) hypertension: Secondary | ICD-10-CM | POA: Diagnosis not present

## 2017-01-13 DIAGNOSIS — J449 Chronic obstructive pulmonary disease, unspecified: Secondary | ICD-10-CM | POA: Diagnosis not present

## 2017-01-13 DIAGNOSIS — I442 Atrioventricular block, complete: Secondary | ICD-10-CM | POA: Diagnosis not present

## 2017-01-13 DIAGNOSIS — N183 Chronic kidney disease, stage 3 (moderate): Secondary | ICD-10-CM | POA: Diagnosis not present

## 2017-01-13 DIAGNOSIS — I519 Heart disease, unspecified: Secondary | ICD-10-CM | POA: Diagnosis not present

## 2017-01-13 DIAGNOSIS — I4891 Unspecified atrial fibrillation: Secondary | ICD-10-CM | POA: Diagnosis not present

## 2017-01-17 DIAGNOSIS — N183 Chronic kidney disease, stage 3 (moderate): Secondary | ICD-10-CM | POA: Diagnosis not present

## 2017-01-17 DIAGNOSIS — J449 Chronic obstructive pulmonary disease, unspecified: Secondary | ICD-10-CM | POA: Diagnosis not present

## 2017-01-17 DIAGNOSIS — I10 Essential (primary) hypertension: Secondary | ICD-10-CM | POA: Diagnosis not present

## 2017-01-17 DIAGNOSIS — F0391 Unspecified dementia with behavioral disturbance: Secondary | ICD-10-CM | POA: Diagnosis not present

## 2017-01-17 DIAGNOSIS — I519 Heart disease, unspecified: Secondary | ICD-10-CM | POA: Diagnosis not present

## 2017-01-17 DIAGNOSIS — I442 Atrioventricular block, complete: Secondary | ICD-10-CM | POA: Diagnosis not present

## 2017-01-17 DIAGNOSIS — F331 Major depressive disorder, recurrent, moderate: Secondary | ICD-10-CM | POA: Diagnosis not present

## 2017-01-17 DIAGNOSIS — I4891 Unspecified atrial fibrillation: Secondary | ICD-10-CM | POA: Diagnosis not present

## 2017-01-18 DIAGNOSIS — I442 Atrioventricular block, complete: Secondary | ICD-10-CM | POA: Diagnosis not present

## 2017-01-18 DIAGNOSIS — I10 Essential (primary) hypertension: Secondary | ICD-10-CM | POA: Diagnosis not present

## 2017-01-18 DIAGNOSIS — I4891 Unspecified atrial fibrillation: Secondary | ICD-10-CM | POA: Diagnosis not present

## 2017-01-18 DIAGNOSIS — J449 Chronic obstructive pulmonary disease, unspecified: Secondary | ICD-10-CM | POA: Diagnosis not present

## 2017-01-18 DIAGNOSIS — N183 Chronic kidney disease, stage 3 (moderate): Secondary | ICD-10-CM | POA: Diagnosis not present

## 2017-01-18 DIAGNOSIS — I519 Heart disease, unspecified: Secondary | ICD-10-CM | POA: Diagnosis not present

## 2017-01-19 DIAGNOSIS — I10 Essential (primary) hypertension: Secondary | ICD-10-CM | POA: Diagnosis not present

## 2017-01-19 DIAGNOSIS — I4891 Unspecified atrial fibrillation: Secondary | ICD-10-CM | POA: Diagnosis not present

## 2017-01-19 DIAGNOSIS — I442 Atrioventricular block, complete: Secondary | ICD-10-CM | POA: Diagnosis not present

## 2017-01-19 DIAGNOSIS — I519 Heart disease, unspecified: Secondary | ICD-10-CM | POA: Diagnosis not present

## 2017-01-19 DIAGNOSIS — J449 Chronic obstructive pulmonary disease, unspecified: Secondary | ICD-10-CM | POA: Diagnosis not present

## 2017-01-19 DIAGNOSIS — N183 Chronic kidney disease, stage 3 (moderate): Secondary | ICD-10-CM | POA: Diagnosis not present

## 2017-01-20 DIAGNOSIS — J449 Chronic obstructive pulmonary disease, unspecified: Secondary | ICD-10-CM | POA: Diagnosis not present

## 2017-01-20 DIAGNOSIS — I519 Heart disease, unspecified: Secondary | ICD-10-CM | POA: Diagnosis not present

## 2017-01-20 DIAGNOSIS — F331 Major depressive disorder, recurrent, moderate: Secondary | ICD-10-CM | POA: Diagnosis not present

## 2017-01-20 DIAGNOSIS — N183 Chronic kidney disease, stage 3 (moderate): Secondary | ICD-10-CM | POA: Diagnosis not present

## 2017-01-20 DIAGNOSIS — I10 Essential (primary) hypertension: Secondary | ICD-10-CM | POA: Diagnosis not present

## 2017-01-20 DIAGNOSIS — F0391 Unspecified dementia with behavioral disturbance: Secondary | ICD-10-CM | POA: Diagnosis not present

## 2017-01-20 DIAGNOSIS — I4891 Unspecified atrial fibrillation: Secondary | ICD-10-CM | POA: Diagnosis not present

## 2017-01-20 DIAGNOSIS — I442 Atrioventricular block, complete: Secondary | ICD-10-CM | POA: Diagnosis not present

## 2017-01-24 DIAGNOSIS — N183 Chronic kidney disease, stage 3 (moderate): Secondary | ICD-10-CM | POA: Diagnosis not present

## 2017-01-24 DIAGNOSIS — J449 Chronic obstructive pulmonary disease, unspecified: Secondary | ICD-10-CM | POA: Diagnosis not present

## 2017-01-24 DIAGNOSIS — F411 Generalized anxiety disorder: Secondary | ICD-10-CM | POA: Diagnosis not present

## 2017-01-24 DIAGNOSIS — I4891 Unspecified atrial fibrillation: Secondary | ICD-10-CM | POA: Diagnosis not present

## 2017-01-24 DIAGNOSIS — I442 Atrioventricular block, complete: Secondary | ICD-10-CM | POA: Diagnosis not present

## 2017-01-24 DIAGNOSIS — I519 Heart disease, unspecified: Secondary | ICD-10-CM | POA: Diagnosis not present

## 2017-01-24 DIAGNOSIS — F339 Major depressive disorder, recurrent, unspecified: Secondary | ICD-10-CM | POA: Diagnosis not present

## 2017-01-24 DIAGNOSIS — I10 Essential (primary) hypertension: Secondary | ICD-10-CM | POA: Diagnosis not present

## 2017-01-25 DIAGNOSIS — I4891 Unspecified atrial fibrillation: Secondary | ICD-10-CM | POA: Diagnosis not present

## 2017-01-25 DIAGNOSIS — J449 Chronic obstructive pulmonary disease, unspecified: Secondary | ICD-10-CM | POA: Diagnosis not present

## 2017-01-25 DIAGNOSIS — I519 Heart disease, unspecified: Secondary | ICD-10-CM | POA: Diagnosis not present

## 2017-01-25 DIAGNOSIS — I442 Atrioventricular block, complete: Secondary | ICD-10-CM | POA: Diagnosis not present

## 2017-01-25 DIAGNOSIS — I10 Essential (primary) hypertension: Secondary | ICD-10-CM | POA: Diagnosis not present

## 2017-01-25 DIAGNOSIS — N183 Chronic kidney disease, stage 3 (moderate): Secondary | ICD-10-CM | POA: Diagnosis not present

## 2017-01-26 DIAGNOSIS — I4891 Unspecified atrial fibrillation: Secondary | ICD-10-CM | POA: Diagnosis not present

## 2017-01-26 DIAGNOSIS — J449 Chronic obstructive pulmonary disease, unspecified: Secondary | ICD-10-CM | POA: Diagnosis not present

## 2017-01-26 DIAGNOSIS — I10 Essential (primary) hypertension: Secondary | ICD-10-CM | POA: Diagnosis not present

## 2017-01-26 DIAGNOSIS — I442 Atrioventricular block, complete: Secondary | ICD-10-CM | POA: Diagnosis not present

## 2017-01-26 DIAGNOSIS — I519 Heart disease, unspecified: Secondary | ICD-10-CM | POA: Diagnosis not present

## 2017-01-26 DIAGNOSIS — N183 Chronic kidney disease, stage 3 (moderate): Secondary | ICD-10-CM | POA: Diagnosis not present

## 2017-01-27 DIAGNOSIS — N183 Chronic kidney disease, stage 3 (moderate): Secondary | ICD-10-CM | POA: Diagnosis not present

## 2017-01-27 DIAGNOSIS — I519 Heart disease, unspecified: Secondary | ICD-10-CM | POA: Diagnosis not present

## 2017-01-27 DIAGNOSIS — I4891 Unspecified atrial fibrillation: Secondary | ICD-10-CM | POA: Diagnosis not present

## 2017-01-27 DIAGNOSIS — I442 Atrioventricular block, complete: Secondary | ICD-10-CM | POA: Diagnosis not present

## 2017-01-27 DIAGNOSIS — J449 Chronic obstructive pulmonary disease, unspecified: Secondary | ICD-10-CM | POA: Diagnosis not present

## 2017-01-27 DIAGNOSIS — I10 Essential (primary) hypertension: Secondary | ICD-10-CM | POA: Diagnosis not present

## 2017-01-28 DIAGNOSIS — I4891 Unspecified atrial fibrillation: Secondary | ICD-10-CM | POA: Diagnosis not present

## 2017-01-28 DIAGNOSIS — I519 Heart disease, unspecified: Secondary | ICD-10-CM | POA: Diagnosis not present

## 2017-01-28 DIAGNOSIS — I10 Essential (primary) hypertension: Secondary | ICD-10-CM | POA: Diagnosis not present

## 2017-01-28 DIAGNOSIS — N183 Chronic kidney disease, stage 3 (moderate): Secondary | ICD-10-CM | POA: Diagnosis not present

## 2017-01-28 DIAGNOSIS — J449 Chronic obstructive pulmonary disease, unspecified: Secondary | ICD-10-CM | POA: Diagnosis not present

## 2017-01-28 DIAGNOSIS — I442 Atrioventricular block, complete: Secondary | ICD-10-CM | POA: Diagnosis not present

## 2017-01-30 DIAGNOSIS — I519 Heart disease, unspecified: Secondary | ICD-10-CM | POA: Diagnosis not present

## 2017-01-30 DIAGNOSIS — I442 Atrioventricular block, complete: Secondary | ICD-10-CM | POA: Diagnosis not present

## 2017-01-30 DIAGNOSIS — J449 Chronic obstructive pulmonary disease, unspecified: Secondary | ICD-10-CM | POA: Diagnosis not present

## 2017-01-30 DIAGNOSIS — I10 Essential (primary) hypertension: Secondary | ICD-10-CM | POA: Diagnosis not present

## 2017-01-30 DIAGNOSIS — I4891 Unspecified atrial fibrillation: Secondary | ICD-10-CM | POA: Diagnosis not present

## 2017-01-30 DIAGNOSIS — N183 Chronic kidney disease, stage 3 (moderate): Secondary | ICD-10-CM | POA: Diagnosis not present

## 2017-02-22 DIAGNOSIS — F331 Major depressive disorder, recurrent, moderate: Secondary | ICD-10-CM | POA: Diagnosis not present

## 2017-02-22 DIAGNOSIS — F0391 Unspecified dementia with behavioral disturbance: Secondary | ICD-10-CM | POA: Diagnosis not present

## 2017-02-24 DEATH — deceased
